# Patient Record
Sex: Female | Born: 1990 | Race: Black or African American | Hispanic: No | Marital: Single | Smoking: Never smoker
Health system: Southern US, Community
[De-identification: ages and names within clinical notes are randomized; demographics above are authoritative.]

## PROBLEM LIST (undated history)

## (undated) ENCOUNTER — Inpatient Hospital Stay (HOSPITAL_COMMUNITY): Payer: Self-pay

## (undated) DIAGNOSIS — R569 Unspecified convulsions: Secondary | ICD-10-CM

## (undated) DIAGNOSIS — R29898 Other symptoms and signs involving the musculoskeletal system: Secondary | ICD-10-CM

## (undated) HISTORY — DX: Other symptoms and signs involving the musculoskeletal system: R29.898

## (undated) HISTORY — PX: NO PAST SURGERIES: SHX2092

---
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
## 2011-01-11 LAB — COMPREHENSIVE METABOLIC PANEL
ALT: 13 U/L (ref 0–35)
Alkaline Phosphatase: 65 U/L (ref 39–117)
CO2: 29 mEq/L (ref 19–32)
Glucose, Bld: 93 mg/dL (ref 70–99)
Potassium: 3.3 mEq/L — ABNORMAL LOW (ref 3.5–5.1)
Sodium: 140 mEq/L (ref 135–145)
Total Protein: 7.3 g/dL (ref 6.0–8.3)

## 2011-01-11 LAB — RETICULOCYTES: Retic Count, Absolute: 50.8 10*3/uL (ref 19.0–186.0)

## 2011-01-11 LAB — URINALYSIS, ROUTINE W REFLEX MICROSCOPIC
Nitrite: NEGATIVE
Specific Gravity, Urine: 1.036 — ABNORMAL HIGH (ref 1.005–1.030)
Urobilinogen, UA: 1 mg/dL (ref 0.0–1.0)

## 2011-01-11 LAB — CBC
MCHC: 32.1 g/dL (ref 30.0–36.0)
RBC: 5.34 MIL/uL — ABNORMAL HIGH (ref 3.87–5.11)
WBC: 6.2 10*3/uL (ref 4.0–10.5)

## 2011-01-11 LAB — LIPASE, BLOOD: Lipase: 25 U/L (ref 11–59)

## 2011-01-11 LAB — PREGNANCY, URINE

## 2011-01-11 LAB — URINE MICROSCOPIC-ADD ON

## 2011-01-11 LAB — DIFFERENTIAL
Eosinophils Absolute: 0 10*3/uL (ref 0.0–0.7)
Eosinophils Relative: 0 % (ref 0–5)
Lymphs Abs: 0.8 10*3/uL (ref 0.7–4.0)
Monocytes Absolute: 0.4 10*3/uL (ref 0.1–1.0)

## 2011-10-15 ENCOUNTER — Emergency Department (HOSPITAL_BASED_OUTPATIENT_CLINIC_OR_DEPARTMENT_OTHER)
Admission: EM | Admit: 2011-10-15 | Discharge: 2011-10-15 | Disposition: A | Discharge status: Home / Self Care | Payer: Medicaid Other | Attending: Emergency Medicine | Admitting: Emergency Medicine

## 2011-10-15 ENCOUNTER — Encounter (HOSPITAL_BASED_OUTPATIENT_CLINIC_OR_DEPARTMENT_OTHER): Payer: Self-pay | Admitting: *Deleted

## 2011-10-15 DIAGNOSIS — H9209 Otalgia, unspecified ear: Secondary | ICD-10-CM | POA: Insufficient documentation

## 2011-10-15 DIAGNOSIS — J069 Acute upper respiratory infection, unspecified: Secondary | ICD-10-CM | POA: Insufficient documentation

## 2011-10-15 HISTORY — DX: Unspecified convulsions: R56.9

## 2011-10-15 MED ORDER — HYDROCOD POLST-CHLORPHEN POLST 10-8 MG/5ML PO LQCR: 5.0000 mL | Freq: Two times a day (BID) | ORAL | Status: DC | PRN

## 2011-10-15 NOTE — ED Notes (Signed)
Cough, runny nose, sore throat and headache x 2 days.

## 2011-10-15 NOTE — ED Provider Notes (Signed)
History     CSN: [id number]  Arrival date & time 10/15/11  1334   First MD Initiated Contact with Patient 10/15/11 1344      Chief Complaint  Patient presents with  . URI    (Consider location/radiation/quality/duration/timing/severity/associated sxs/prior treatment) Patient is a 21 y.o. female presenting with URI. The history is provided by the patient.  URI The primary symptoms include fatigue, ear pain, sore throat and cough. Primary symptoms do not include fever, nausea or vomiting. The current episode started 2 days ago. This is a new problem.  Symptoms associated with the illness include plugged ear sensation, congestion and rhinorrhea. The illness is not associated with chills or facial pain.    Past Medical History  Diagnosis Date  . Seizures     History reviewed. No pertinent past surgical history.  No family history on file.  History  Substance Use Topics  . Smoking status: Never Smoker   . Smokeless tobacco: Not on file  . Alcohol Use: No    OB History    Grav Para Term Preterm Abortions TAB SAB Ect Mult Living                  Review of Systems  Constitutional: Positive for fatigue. Negative for fever and chills.  HENT: Positive for ear pain, congestion, sore throat and rhinorrhea.   Respiratory: Positive for cough.   Gastrointestinal: Negative for nausea and vomiting.  All other systems reviewed and are negative.    Allergies  Review of patient's allergies indicates no known allergies.  Home Medications  No current outpatient prescriptions on file.  BP 125/84  Pulse 99  Temp(Src) 98.2 F (36.8 C) (Oral)  Resp 20  Wt 126 lb (57.153 kg)  SpO2 99%  LMP 10/07/2011  Physical Exam  Nursing note and vitals reviewed. Constitutional: She is oriented to person, place, and time. She appears well-developed and well-nourished. No distress.  HENT:  Head: Normocephalic and atraumatic.  Right Ear: External ear normal.  Left Ear: External ear  normal.  Mouth/Throat: Oropharynx is clear and moist. No oropharyngeal exudate.  Neck: Normal range of motion. Neck supple.  Cardiovascular: Normal rate and regular rhythm.  Exam reveals no gallop and no friction rub.   No murmur heard. Pulmonary/Chest: Effort normal and breath sounds normal. No respiratory distress. She has no wheezes.  Abdominal: Soft. Bowel sounds are normal. She exhibits no distension. There is no tenderness.  Musculoskeletal: Normal range of motion.  Lymphadenopathy:    She has no cervical adenopathy.  Neurological: She is alert and oriented to person, place, and time.  Skin: Skin is warm and dry. She is not diaphoretic.    ED Course  Procedures (including critical care time)   Labs Reviewed  RAPID STREP SCREEN   No results found.   No diagnosis found.    MDM  The strep test is negative.  This illness is almost certainly viral in nature.  Will prescribe tussionex for cough and this should help with some of the aches and pains she is reporting.  Could possibly be influenza, however out of the age group and time range to prescribe tamiflu.          Geoffery Lyons, MD 10/15/11 [phone]

## 2012-08-24 ENCOUNTER — Encounter (HOSPITAL_COMMUNITY): Payer: Self-pay | Admitting: *Deleted

## 2012-08-24 ENCOUNTER — Emergency Department (HOSPITAL_COMMUNITY)
Admission: EM | Admit: 2012-08-24 | Discharge: 2012-08-25 | Discharge status: Against Medical Advice | Payer: Medicaid Other | Attending: Emergency Medicine | Admitting: Emergency Medicine

## 2012-08-24 DIAGNOSIS — R52 Pain, unspecified: Secondary | ICD-10-CM | POA: Insufficient documentation

## 2012-08-24 DIAGNOSIS — N949 Unspecified condition associated with female genital organs and menstrual cycle: Secondary | ICD-10-CM | POA: Insufficient documentation

## 2012-08-24 DIAGNOSIS — K920 Hematemesis: Secondary | ICD-10-CM | POA: Insufficient documentation

## 2012-08-24 NOTE — ED Notes (Addendum)
Pt states her period started today. Pt states her BC pill isn't working. Everytime she starts her cycle she vomits and she started vomiting blood today. Pt also states she cannot breath and is having generalized body pain "pain all over body. "Pt has pants unzipped in triage due to pain in her pelvis.

## 2012-08-24 NOTE — ED Notes (Signed)
Pt called from triage and general wait with no reply.

## 2012-08-24 NOTE — ED Notes (Signed)
Pt refused blood work  

## 2012-08-25 ENCOUNTER — Emergency Department (HOSPITAL_COMMUNITY)
Admission: EM | Admit: 2012-08-25 | Discharge: 2012-08-25 | Disposition: A | Discharge status: Home / Self Care | Payer: Medicaid Other | Source: Home / Self Care | Attending: Family Medicine | Admitting: Family Medicine

## 2012-08-25 ENCOUNTER — Encounter (HOSPITAL_COMMUNITY): Payer: Self-pay | Admitting: Emergency Medicine

## 2012-08-25 DIAGNOSIS — N946 Dysmenorrhea, unspecified: Secondary | ICD-10-CM

## 2012-08-25 LAB — POCT URINALYSIS DIP (DEVICE)
Glucose, UA: 100 mg/dL — AB
Ketones, ur: 40 mg/dL — AB
pH: 8.5 — ABNORMAL HIGH (ref 5.0–8.0)

## 2012-08-25 MED ORDER — ONDANSETRON 4 MG PO TBDP
ORAL_TABLET | ORAL | Status: AC
  Filled 2012-08-25: qty 2

## 2012-08-25 MED ORDER — ONDANSETRON 4 MG PO TBDP
8.0000 mg | ORAL_TABLET | Freq: Once | ORAL | Status: AC
  Administered 2012-08-25: 8 mg via ORAL

## 2012-08-25 MED ORDER — KETOROLAC TROMETHAMINE 30 MG/ML IJ SOLN
30.0000 mg | Freq: Once | INTRAMUSCULAR | Status: AC
  Administered 2012-08-25: 30 mg via INTRAMUSCULAR

## 2012-08-25 MED ORDER — KETOROLAC TROMETHAMINE 30 MG/ML IJ SOLN
INTRAMUSCULAR | Status: AC
  Filled 2012-08-25: qty 1

## 2012-08-25 MED ORDER — TRAMADOL HCL 50 MG PO TABS: 50.0000 mg | ORAL_TABLET | Freq: Four times a day (QID) | ORAL | Status: DC | PRN

## 2012-08-25 MED ORDER — IBUPROFEN 600 MG PO TABS: 600.0000 mg | ORAL_TABLET | Freq: Three times a day (TID) | ORAL | Status: DC | PRN

## 2012-08-25 MED ORDER — PROMETHAZINE HCL 25 MG PO TABS: 25.0000 mg | ORAL_TABLET | Freq: Three times a day (TID) | ORAL | Status: DC | PRN

## 2012-08-25 NOTE — ED Provider Notes (Signed)
History     CSN: [id number]  Arrival date & time 08/25/12  0945   First MD Initiated Contact with Patient 08/25/12 1018      Chief Complaint  Patient presents with  . Abdominal Pain    (Consider location/radiation/quality/duration/timing/severity/associated sxs/prior treatment) HPI Comments: 21 year old female with history of painful menstrual periods. Here with her mother complaining of menstrual cramps and low back pain since yesterday. Started with her period yesterday. Has not taken any pain medication as in the past her doctor prescribed naproxen but this medication does not help with her cramps as per patient. Denies heavy bleeding. Denies dysuria. Denies pelvic pain or vaginal discharge prior the beginning of her menstruation and menstrual cramps. No headache or dizziness. No nausea vomiting or diarrhea. Last bowel movement was 2 days ago and normal. Patient has history of constipation and defecates about twice a week when regular. No fever or chills.   Past Medical History  Diagnosis Date  . Seizures     History reviewed. No pertinent past surgical history.  History reviewed. No pertinent family history.  History  Substance Use Topics  . Smoking status: Never Smoker   . Smokeless tobacco: Not on file  . Alcohol Use: No    OB History    Grav Para Term Preterm Abortions TAB SAB Ect Mult Living                  Review of Systems  Constitutional: Negative for fever and chills.  Gastrointestinal: Positive for abdominal pain.  Genitourinary: Positive for vaginal bleeding, menstrual problem and pelvic pain. Negative for dysuria, urgency, frequency, hematuria, flank pain, vaginal discharge, difficulty urinating and genital sores.  Musculoskeletal: Positive for back pain.  Skin: Negative for rash.  Neurological: Negative for dizziness and headaches.    Allergies  Review of patient's allergies indicates no known allergies.  Home Medications   Current Outpatient Rx   Name  Route  Sig  Dispense  Refill  . IBUPROFEN 600 MG PO TABS   Oral   Take 1 tablet (600 mg total) by mouth every 8 (eight) hours as needed for pain.   20 tablet   1   . PROMETHAZINE HCL 25 MG PO TABS   Oral   Take 1 tablet (25 mg total) by mouth every 8 (eight) hours as needed for nausea.   15 tablet   0   . TRAMADOL HCL 50 MG PO TABS   Oral   Take 1 tablet (50 mg total) by mouth every 6 (six) hours as needed for pain.   20 tablet   0     BP 128/75  Pulse 91  Temp 99.9 F (37.7 C) (Oral)  Resp 20  SpO2 100%  Physical Exam  Constitutional: She is oriented to person, place, and time. She appears well-developed and well-nourished.       Uncomfortable with pain  HENT:  Mouth/Throat: Oropharynx is clear and moist. No oropharyngeal exudate.  Eyes: Conjunctivae normal are normal. No scleral icterus.  Neck: No thyromegaly present.  Cardiovascular: Normal heart sounds.   Pulmonary/Chest: Breath sounds normal.  Abdominal: Soft. Bowel sounds are normal. She exhibits no distension and no mass. There is no tenderness. There is no rebound and no guarding.  Neurological: She is alert and oriented to person, place, and time.  Skin: No rash noted. She is not diaphoretic.    ED Course  Procedures (including critical care time)  Labs Reviewed  POCT URINALYSIS DIP (DEVICE) - Abnormal;  Notable for the following:    Glucose, UA 100 (*)     Bilirubin Urine SMALL (*)     Ketones, ur 40 (*)     Hgb urine dipstick LARGE (*)     pH 8.5 (*)     Protein, ur 100 (*)     Urobilinogen, UA 2.0 (*)     Leukocytes, UA TRACE (*)  Biochemical Testing Only. Please order routine urinalysis from main lab if confirmatory testing is needed.   All other components within normal limits  POCT PREGNANCY, URINE  POCT RAPID STREP A ([facility] CARE ONLY)   No results found.   1. Dysmenorrhea       MDM  Normal abdominal exam. Treated here with Toradol 30 mg IM x1 and ondansetron 8 mg sublingual  x1. Patient was tolerating fluids and pain was improved prior to discharge. Prescribed ibuprofen 600 mg 3 times a day when necessary and tramadol every 6 hours as needed. Also given a prescription for promethazine 25 mg 3 times a day when necessary for nausea vomiting. Red flags that should prompt her return to medical attention this cause with patient, her mother and provided in writing.        Sharin Grave, MD 08/26/12 2013

## 2012-08-25 NOTE — ED Notes (Signed)
Reports abd. Pain coming from menstrual cycle.  Patient reports vomiting and becoming nausea every month.  Patient states the pain is in the pelvis area and butt.  Patient takes naproxen for pain but no relief.

## 2012-08-25 NOTE — ED Notes (Addendum)
Pt called no answer 

## 2012-08-25 NOTE — ED Notes (Signed)
Went to send patient to restroom for urine specimen, patient's companion very rude, wanted to know when doctor is coming in," has been waiting too long" per companion. I told this person that provider was in with another patient and would be in soon.

## 2013-03-19 ENCOUNTER — Encounter (HOSPITAL_BASED_OUTPATIENT_CLINIC_OR_DEPARTMENT_OTHER): Payer: Self-pay

## 2013-03-19 ENCOUNTER — Emergency Department (HOSPITAL_BASED_OUTPATIENT_CLINIC_OR_DEPARTMENT_OTHER)
Admission: EM | Admit: 2013-03-19 | Discharge: 2013-03-19 | Disposition: A | Discharge status: Home / Self Care | Payer: Medicaid Other | Attending: Emergency Medicine | Admitting: Emergency Medicine

## 2013-03-19 DIAGNOSIS — R112 Nausea with vomiting, unspecified: Secondary | ICD-10-CM | POA: Insufficient documentation

## 2013-03-19 DIAGNOSIS — J029 Acute pharyngitis, unspecified: Secondary | ICD-10-CM | POA: Insufficient documentation

## 2013-03-19 DIAGNOSIS — N390 Urinary tract infection, site not specified: Secondary | ICD-10-CM | POA: Insufficient documentation

## 2013-03-19 DIAGNOSIS — H53149 Visual discomfort, unspecified: Secondary | ICD-10-CM | POA: Insufficient documentation

## 2013-03-19 DIAGNOSIS — Z3202 Encounter for pregnancy test, result negative: Secondary | ICD-10-CM | POA: Insufficient documentation

## 2013-03-19 DIAGNOSIS — Z8669 Personal history of other diseases of the nervous system and sense organs: Secondary | ICD-10-CM | POA: Insufficient documentation

## 2013-03-19 LAB — URINE MICROSCOPIC-ADD ON

## 2013-03-19 LAB — URINALYSIS, ROUTINE W REFLEX MICROSCOPIC
Hgb urine dipstick: NEGATIVE
Nitrite: NEGATIVE
Protein, ur: 30 mg/dL — AB
Specific Gravity, Urine: 1.03 (ref 1.005–1.030)
Urobilinogen, UA: 2 mg/dL — ABNORMAL HIGH (ref 0.0–1.0)

## 2013-03-19 LAB — PREGNANCY, URINE: Preg Test, Ur: NEGATIVE

## 2013-03-19 MED ORDER — METOCLOPRAMIDE HCL 5 MG/ML IJ SOLN
10.0000 mg | Freq: Once | INTRAMUSCULAR | Status: AC
  Administered 2013-03-19: 10 mg via INTRAVENOUS
  Filled 2013-03-19: qty 2

## 2013-03-19 MED ORDER — DIPHENHYDRAMINE HCL 50 MG/ML IJ SOLN
25.0000 mg | Freq: Once | INTRAMUSCULAR | Status: AC
  Administered 2013-03-19: 05:00:00 via INTRAVENOUS
  Filled 2013-03-19: qty 1

## 2013-03-19 MED ORDER — DEXTROSE 5 % IV SOLN
1.0000 g | Freq: Once | INTRAVENOUS | Status: AC
  Administered 2013-03-19: 1 g via INTRAVENOUS
  Filled 2013-03-19: qty 10

## 2013-03-19 MED ORDER — SODIUM CHLORIDE 0.9 % IV BOLUS (SEPSIS)
1000.0000 mL | Freq: Once | INTRAVENOUS | Status: AC
  Administered 2013-03-19: 1000 mL via INTRAVENOUS

## 2013-03-19 MED ORDER — NITROFURANTOIN MONOHYD MACRO 100 MG PO CAPS: 100.0000 mg | ORAL_CAPSULE | Freq: Two times a day (BID) | ORAL | Status: DC

## 2013-03-19 MED ORDER — KETOROLAC TROMETHAMINE 15 MG/ML IJ SOLN
15.0000 mg | Freq: Once | INTRAMUSCULAR | Status: AC
  Administered 2013-03-19: 15 mg via INTRAVENOUS
  Filled 2013-03-19: qty 1

## 2013-03-19 NOTE — ED Notes (Signed)
Pt states that her mom is not able to pick her up as planned. Pt has no family or resources to provide a ride. Will provide a cab voucher for pt.

## 2013-03-19 NOTE — ED Notes (Signed)
Patient here with generalized headache since Friday. Reports now she has had nausea and some vomiting with same. Denies trauma. Alert and oriented. Taking otc meds without relief

## 2013-03-19 NOTE — ED Provider Notes (Signed)
History     CSN: [id number]  Arrival date & time 03/19/13  0251   First MD Initiated Contact with Patient 03/19/13 [phone]      Chief Complaint  Patient presents with  . Headache    (Consider location/radiation/quality/duration/timing/severity/associated sxs/prior treatment) HPI This is a 22 year old female with a three-day history of headache. The headache began in her midforehead but is subsequently generalized. She is unable to characterize or quantify the pain except as "it's bad". The pain has waxed and waned. It has not been relieved with ibuprofen or Tylenol. It has been accompanied by nausea and vomiting. It has also been accompanied by mild photophobia and mild sore throat. She states she's not had anything to eat or drink in 3 days due to the nausea. She denies any neck stiffness, abdominal pain, dysuria or diarrhea.  Past Medical History  Diagnosis Date  . Seizures     History reviewed. No pertinent past surgical history.  No family history on file.  History  Substance Use Topics  . Smoking status: Never Smoker   . Smokeless tobacco: Not on file  . Alcohol Use: No    OB History   Grav Para Term Preterm Abortions TAB SAB Ect Mult Living                  Review of Systems  All other systems reviewed and are negative.    Allergies  Review of patient's allergies indicates no known allergies.  Home Medications   Current Outpatient Rx  Name  Route  Sig  Dispense  Refill  . ibuprofen (ADVIL,MOTRIN) 600 MG tablet   Oral   Take 1 tablet (600 mg total) by mouth every 8 (eight) hours as needed for pain.   20 tablet   1     BP 149/84  Pulse 100  Temp(Src) 99.6 F (37.6 C) (Oral)  Resp 18  Wt 169 lb (76.658 kg)  SpO2 98%  Physical Exam General: Well-developed, well-nourished female in no acute distress; appearance consistent with age of record HENT: normocephalic, atraumatic; no pharyngeal erythema or exudate Eyes: pupils equal round and reactive to  light; extraocular muscles intact Neck: supple; no lymphadenopathy Heart: regular rate and rhythm Lungs: clear to auscultation bilaterally Abdomen: soft; nondistended; nontender; no masses or hepatosplenomegaly; bowel sounds present GU: No CVA tenderness Extremities: No deformity; full range of motion; pulses normal Neurologic: Awake, alert and oriented; motor function intact in all extremities and symmetric; no facial droop; normal coordination and speech Skin: Warm and dry Psychiatric: Normal mood and affect    ED Course  Procedures (including critical care time)    MDM   Nursing notes and vitals signs, including pulse oximetry, reviewed.  Summary of this visit's results, reviewed by myself:  Labs:  Results for orders placed during the hospital encounter of 03/19/13 (from the past 24 hour(s))  URINALYSIS, ROUTINE W REFLEX MICROSCOPIC     Status: Abnormal   Collection Time    03/19/13  4:33 AM      Result Value Range   Color, Urine YELLOW  YELLOW   APPearance CLOUDY (*) CLEAR   Specific Gravity, Urine 1.030  1.005 - 1.030   pH 7.0  5.0 - 8.0   Glucose, UA NEGATIVE  NEGATIVE mg/dL   Hgb urine dipstick NEGATIVE  NEGATIVE   Bilirubin Urine SMALL (*) NEGATIVE   Ketones, ur 15 (*) NEGATIVE mg/dL   Protein, ur 30 (*) NEGATIVE mg/dL   Urobilinogen, UA 2.0 (*) 0.0 -  1.0 mg/dL   Nitrite NEGATIVE  NEGATIVE   Leukocytes, UA LARGE (*) NEGATIVE  PREGNANCY, URINE     Status: None   Collection Time    03/19/13  4:33 AM      Result Value Range   Preg Test, Ur NEGATIVE  NEGATIVE  URINE MICROSCOPIC-ADD ON     Status: Abnormal   Collection Time    03/19/13  4:33 AM      Result Value Range   Squamous Epithelial / LPF MANY (*) RARE   WBC, UA TOO NUMEROUS TO COUNT  <3 WBC/hpf   RBC / HPF 0-2  <3 RBC/hpf   Bacteria, UA MANY (*) RARE   5:47 AM Headache improved after IV medications and IV fluids. Rocephin 1 g given to initiate treatment for urinary tract  infection.        Hanley Seamen, MD 03/19/13 [phone]

## 2013-03-20 LAB — URINE CULTURE: Colony Count: 70000

## 2013-09-27 NOTE — L&D Delivery Note (Signed)
Delivery Note Pt reached complete dilation and pushed about an hour.  At 7:05 PM a healthy female was delivered via Vaginal, Spontaneous Delivery (Presentation: ; Occiput Anterior).  APGAR: 8, 9; weight 6 lb 7 oz (2920 g).   Placenta status: Intact, Spontaneous.  Cord: 3 vessels with the following complications: None.    Anesthesia: None  Episiotomy: None Lacerations: 2nd degree;Perineal Suture Repair: 3.0 vicryl rapide Est. Blood Loss (mL): 350cc  Mom to postpartum.  Baby to Couplet care / Skin to Skin. D/w pt circumcision.  Plan to do in office.  Oliver PilaRICHARDSON,Zakariyah Freimark W 08/27/2014, 7:48 PM

## 2013-11-26 ENCOUNTER — Emergency Department (HOSPITAL_COMMUNITY)
Admission: EM | Admit: 2013-11-26 | Discharge: 2013-11-26 | Disposition: A | Discharge status: Home / Self Care | Payer: Medicaid Other | Attending: Emergency Medicine | Admitting: Emergency Medicine

## 2013-11-26 ENCOUNTER — Encounter (HOSPITAL_COMMUNITY): Payer: Self-pay | Admitting: Emergency Medicine

## 2013-11-26 ENCOUNTER — Emergency Department (HOSPITAL_COMMUNITY): Payer: Medicaid Other

## 2013-11-26 DIAGNOSIS — R071 Chest pain on breathing: Secondary | ICD-10-CM | POA: Insufficient documentation

## 2013-11-26 DIAGNOSIS — R079 Chest pain, unspecified: Secondary | ICD-10-CM

## 2013-11-26 DIAGNOSIS — Z8669 Personal history of other diseases of the nervous system and sense organs: Secondary | ICD-10-CM | POA: Insufficient documentation

## 2013-11-26 DIAGNOSIS — R0789 Other chest pain: Secondary | ICD-10-CM

## 2013-11-26 LAB — RAPID URINE DRUG SCREEN, HOSP PERFORMED
Amphetamines: NOT DETECTED
BENZODIAZEPINES: NOT DETECTED
Barbiturates: NOT DETECTED
Cocaine: NOT DETECTED
Opiates: POSITIVE — AB
Tetrahydrocannabinol: NOT DETECTED

## 2013-11-26 LAB — COMPREHENSIVE METABOLIC PANEL
ALK PHOS: 94 U/L (ref 39–117)
ALT: 10 U/L (ref 0–35)
AST: 16 U/L (ref 0–37)
Albumin: 3.4 g/dL — ABNORMAL LOW (ref 3.5–5.2)
BUN: 7 mg/dL (ref 6–23)
CALCIUM: 9.1 mg/dL (ref 8.4–10.5)
CO2: 24 mEq/L (ref 19–32)
Chloride: 104 mEq/L (ref 96–112)
Creatinine, Ser: 0.82 mg/dL (ref 0.50–1.10)
GFR calc non Af Amer: >90 mL/min (ref >90)
GLUCOSE: 93 mg/dL (ref 70–99)
Potassium: 3.5 mEq/L — ABNORMAL LOW (ref 3.7–5.3)
Sodium: 139 mEq/L (ref 137–147)
TOTAL PROTEIN: 7 g/dL (ref 6.0–8.3)
Total Bilirubin: 0.2 mg/dL — ABNORMAL LOW (ref 0.3–1.2)

## 2013-11-26 LAB — CBC
HEMATOCRIT: 34.2 % — AB (ref 36.0–46.0)
HEMOGLOBIN: 10.8 g/dL — AB (ref 12.0–15.0)
MCH: 24.9 pg — AB (ref 26.0–34.0)
MCHC: 31.6 g/dL (ref 30.0–36.0)
MCV: 78.8 fL (ref 78.0–100.0)
Platelets: 247 10*3/uL (ref 150–400)
RBC: 4.34 MIL/uL (ref 3.87–5.11)
RDW: 14.8 % (ref 11.5–15.5)
WBC: 5.3 10*3/uL (ref 4.0–10.5)

## 2013-11-26 LAB — TROPONIN I

## 2013-11-26 MED ORDER — HYDROCODONE-ACETAMINOPHEN 5-325 MG PO TABS
1.0000 | ORAL_TABLET | Freq: Once | ORAL | Status: AC
  Administered 2013-11-26: 1 via ORAL
  Filled 2013-11-26: qty 1

## 2013-11-26 MED ORDER — IBUPROFEN 200 MG PO TABS
600.0000 mg | ORAL_TABLET | Freq: Once | ORAL | Status: AC
  Administered 2013-11-26: 600 mg via ORAL
  Filled 2013-11-26: qty 3

## 2013-11-26 NOTE — Discharge Instructions (Signed)
Take motrin or aleve as need for pain. Follow up with primary care doctor in 1 week - see referral - call office to arrange appointment. Return to ER right away if worse, persistent or recurrent chest pain, trouble breathing, fevers, other concern.    You were given pain medication in the ER - no driving for the next 4 hours.     Chest Pain (Nonspecific) It is often hard to give a specific diagnosis for the cause of chest pain. There is always a chance that your pain could be related to something serious, such as a heart attack or a blood clot in the lungs. You need to follow up with your caregiver for further evaluation. CAUSES   Heartburn.  Pneumonia or bronchitis.  Anxiety or stress.  Inflammation around your heart (pericarditis) or lung (pleuritis or pleurisy).  A blood clot in the lung.  A collapsed lung (pneumothorax). It can develop suddenly on its own (spontaneous pneumothorax) or from injury (trauma) to the chest.  Shingles infection (herpes zoster virus). The chest wall is composed of bones, muscles, and cartilage. Any of these can be the source of the pain.  The bones can be bruised by injury.  The muscles or cartilage can be strained by coughing or overwork.  The cartilage can be affected by inflammation and become sore (costochondritis). DIAGNOSIS  Lab tests or other studies, such as X-rays, electrocardiography, stress testing, or cardiac imaging, may be needed to find the cause of your pain.  TREATMENT   Treatment depends on what may be causing your chest pain. Treatment may include:  Acid blockers for heartburn.  Anti-inflammatory medicine.  Pain medicine for inflammatory conditions.  Antibiotics if an infection is present.  You may be advised to change lifestyle habits. This includes stopping smoking and avoiding alcohol, caffeine, and chocolate.  You may be advised to keep your head raised (elevated) when sleeping. This reduces the chance of acid  going backward from your stomach into your esophagus.  Most of the time, nonspecific chest pain will improve within 2 to 3 days with rest and mild pain medicine. HOME CARE INSTRUCTIONS   If antibiotics were prescribed, take your antibiotics as directed. Finish them even if you start to feel better.  For the next few days, avoid physical activities that bring on chest pain. Continue physical activities as directed.  Do not smoke.  Avoid drinking alcohol.  Only take over-the-counter or prescription medicine for pain, discomfort, or fever as directed by your caregiver.  Follow your caregiver's suggestions for further testing if your chest pain does not go away.  Keep any follow-up appointments you made. If you do not go to an appointment, you could develop lasting (chronic) problems with pain. If there is any problem keeping an appointment, you must call to reschedule. SEEK MEDICAL CARE IF:   You think you are having problems from the medicine you are taking. Read your medicine instructions carefully.  Your chest pain does not go away, even after treatment.  You develop a rash with blisters on your chest. SEEK IMMEDIATE MEDICAL CARE IF:   You have increased chest pain or pain that spreads to your arm, neck, jaw, back, or abdomen.  You develop shortness of breath, an increasing cough, or you are coughing up blood.  You have severe back or abdominal pain, feel nauseous, or vomit.  You develop severe weakness, fainting, or chills.  You have a fever. THIS IS AN EMERGENCY. Do not wait to see if the  pain will go away. Get medical help at once. Call your local emergency services (911 in [place] Do not drive yourself to the hospital. MAKE SURE YOU:   Understand these instructions.  Will watch your condition.  Will get help right away if you are not doing well or get worse. Document Released: 06/23/2005 Document Revised: 12/06/2011 Document Reviewed: 04/18/2008 [facility] Patient  Information 2014 [place], [place].      Chest Wall Pain Chest wall pain is pain in or around the bones and muscles of your chest. It may take up to 6 weeks to get better. It may take longer if you must stay physically active in your work and activities.  CAUSES  Chest wall pain may happen on its own. However, it may be caused by:  A viral illness like the flu.  Injury.  Coughing.  Exercise.  Arthritis.  Fibromyalgia.  Shingles. HOME CARE INSTRUCTIONS   Avoid overtiring physical activity. Try not to strain or perform activities that cause pain. This includes any activities using your chest or your abdominal and side muscles, especially if heavy weights are used.  Put ice on the sore area.  Put ice in a plastic bag.  Place a towel between your skin and the bag.  Leave the ice on for 15-20 minutes per hour while awake for the first 2 days.  Only take over-the-counter or prescription medicines for pain, discomfort, or fever as directed by your caregiver. SEEK IMMEDIATE MEDICAL CARE IF:   Your pain increases, or you are very uncomfortable.  You have a fever.  Your chest pain becomes worse.  You have new, unexplained symptoms.  You have nausea or vomiting.  You feel sweaty or lightheaded.  You have a cough with phlegm (sputum), or you cough up blood. MAKE SURE YOU:   Understand these instructions.  Will watch your condition.  Will get help right away if you are not doing well or get worse. Document Released: 09/13/2005 Document Revised: 12/06/2011 Document Reviewed: 05/10/2011 [facility] Patient Information 2014 [place], [place].  Chest Wall Pain Chest wall pain is pain in or around the bones and muscles of your chest. It may take up to 6 weeks to get better. It may take longer if you must stay physically active in your work and activities.  CAUSES  Chest wall pain may happen on its own. However, it may be caused by:  A viral illness like the  flu.  Injury.  Coughing.  Exercise.  Arthritis.  Fibromyalgia.  Shingles. HOME CARE INSTRUCTIONS   Avoid overtiring physical activity. Try not to strain or perform activities that cause pain. This includes any activities using your chest or your abdominal and side muscles, especially if heavy weights are used.  Put ice on the sore area.  Put ice in a plastic bag.  Place a towel between your skin and the bag.  Leave the ice on for 15-20 minutes per hour while awake for the first 2 days.  Only take over-the-counter or prescription medicines for pain, discomfort, or fever as directed by your caregiver. SEEK IMMEDIATE MEDICAL CARE IF:   Your pain increases, or you are very uncomfortable.  You have a fever.  Your chest pain becomes worse.  You have new, unexplained symptoms.  You have nausea or vomiting.  You feel sweaty or lightheaded.  You have a cough with phlegm (sputum), or you cough up blood. MAKE SURE YOU:   Understand these instructions.  Will watch your condition.  Will get help right  away if you are not doing well or get worse. Document Released: 09/13/2005 Document Revised: 12/06/2011 Document Reviewed: 05/10/2011 [facility] Patient Information 2014 [place], [place].

## 2013-11-26 NOTE — ED Notes (Signed)
Pt from home, c/o chest tightness x 1 week, Today pain will not go away per pt. Denies sob, n/v/e. Rates pain 10/10. NSD.

## 2013-11-26 NOTE — ED Provider Notes (Signed)
CSN: [id number]     Arrival date & time 11/26/13  [id number] History   First MD Initiated Contact with Patient 11/26/13 0710     Chief Complaint  Patient presents with  . Chest Pain     (Consider location/radiation/quality/duration/timing/severity/associated sxs/prior Treatment) Patient is a 23 y.o. female presenting with chest pain. The history is provided by the patient and the EMS personnel.  Chest Pain Associated symptoms: no abdominal pain, no back pain, no cough, no fever, no headache, no nausea, no palpitations, no shortness of breath and not vomiting   pt c/o mid chest pain x 1 week. Initially was episodic, lasting seconds per episode. States constant in past couple days. Sharp. Not pleuritic. States worse w turning/rolling over in bed, palpation chest. No associated nv, diaphoresis or sob. Denies any change in pain whether upright or lying supine. Denies recent cough or uri symptoms. No recent febrile illness. No chills/sweats. Non smoker. No drug use. No hx heart or lung disease. No fam hx heart disease or MI. Denies pleuritic pain. No leg pain or swelling. No hx dvt or pe. No recent travel, surgery, ocp use, prolonged immobility or trauma. No chest wall injury or strain.      Past Medical History  Diagnosis Date  . Seizures    History reviewed. No pertinent past surgical history. History reviewed. No pertinent family history. History  Substance Use Topics  . Smoking status: Never Smoker   . Smokeless tobacco: Not on file  . Alcohol Use: No   OB History   Grav Para Term Preterm Abortions TAB SAB Ect Mult Living                 Review of Systems  Constitutional: Negative for fever and chills.  HENT: Negative for congestion and sore throat.   Eyes: Negative for redness.  Respiratory: Negative for cough and shortness of breath.   Cardiovascular: Positive for chest pain. Negative for palpitations and leg swelling.  Gastrointestinal: Negative for nausea, vomiting and abdominal  pain.  Genitourinary: Negative for flank pain.  Musculoskeletal: Negative for back pain and neck pain.  Skin: Negative for rash.  Neurological: Negative for headaches.  Hematological: Does not bruise/bleed easily.  Psychiatric/Behavioral: Negative for confusion.      Allergies  Review of patient's allergies indicates no known allergies.  Home Medications   Current Outpatient Rx  Name  Route  Sig  Dispense  Refill  . ibuprofen (ADVIL,MOTRIN) 200 MG tablet   Oral   Take 400 mg by mouth every 6 (six) hours as needed for moderate pain.          BP 131/77  Pulse 89  Temp(Src) 98.4 F (36.9 C) (Oral)  Resp 12  Ht 5\' 4"  (1.626 m)  Wt 182 lb 4 oz (82.668 kg)  BMI 31.27 kg/m2  SpO2 100% Physical Exam  Nursing note and vitals reviewed. Constitutional: She is oriented to person, place, and time. She appears well-developed and well-nourished. No distress.  HENT:  Mouth/Throat: Oropharynx is clear and moist.  Eyes: Conjunctivae are normal. No scleral icterus.  Neck: Neck supple. No JVD present. No tracheal deviation present.  Cardiovascular: Normal rate, regular rhythm, normal heart sounds and intact distal pulses.  Exam reveals no gallop and no friction rub.   No murmur heard. Pulmonary/Chest: Effort normal and breath sounds normal. No respiratory distress. She exhibits tenderness.  Abdominal: Soft. Normal appearance and bowel sounds are normal. She exhibits no distension. There is no tenderness.  Musculoskeletal: She  exhibits no edema and no tenderness.  Neurological: She is alert and oriented to person, place, and time.  Skin: Skin is warm and dry. No rash noted. She is not diaphoretic.  Psychiatric: She has a normal mood and affect.    ED Course  Procedures (including critical care time)   Results for orders placed during the hospital encounter of 11/26/13  CBC      Result Value Ref Range   WBC 5.3  4.0 - 10.5 K/uL   RBC 4.34  3.87 - 5.11 MIL/uL   Hemoglobin 10.8  (*) 12.0 - 15.0 g/dL   HCT 16.1 (*) 09.6 - 04.5 %   MCV 78.8  78.0 - 100.0 fL   MCH 24.9 (*) 26.0 - 34.0 pg   MCHC 31.6  30.0 - 36.0 g/dL   RDW 40.9  81.1 - 91.4 %   Platelets 247  150 - 400 K/uL  COMPREHENSIVE METABOLIC PANEL      Result Value Ref Range   Sodium 139  137 - 147 mEq/L   Potassium 3.5 (*) 3.7 - 5.3 mEq/L   Chloride 104  96 - 112 mEq/L   CO2 24  19 - 32 mEq/L   Glucose, Bld 93  70 - 99 mg/dL   BUN 7  6 - 23 mg/dL   Creatinine, Ser 7.82  0.50 - 1.10 mg/dL   Calcium 9.1  8.4 - 95.6 mg/dL   Total Protein 7.0  6.0 - 8.3 g/dL   Albumin 3.4 (*) 3.5 - 5.2 g/dL   AST 16  0 - 37 U/L   ALT 10  0 - 35 U/L   Alkaline Phosphatase 94  39 - 117 U/L   Total Bilirubin 0.2 (*) 0.3 - 1.2 mg/dL   GFR calc non Af Amer >90  >90 mL/min   GFR calc Af Amer >90  >90 mL/min  TROPONIN I      Result Value Ref Range   Troponin I <0.30  <0.30 ng/mL  URINE RAPID DRUG SCREEN (HOSP PERFORMED)      Result Value Ref Range   Opiates POSITIVE (*) NONE DETECTED   Cocaine NONE DETECTED  NONE DETECTED   Benzodiazepines NONE DETECTED  NONE DETECTED   Amphetamines NONE DETECTED  NONE DETECTED   Tetrahydrocannabinol NONE DETECTED  NONE DETECTED   Barbiturates NONE DETECTED  NONE DETECTED   Dg Chest 2 View  11/26/2013   CLINICAL DATA:  Shortness of breath and chest pain  EXAM: CHEST  2 VIEW  COMPARISON:  None.  FINDINGS: The lungs are clear. Heart size and pulmonary vascularity are normal. No pneumothorax. No adenopathy. There is mild upper thoracic levoscoliosis.  IMPRESSION: No abnormality noted.   Electronically Signed   By: Bretta Bang M.D.   On: 11/26/2013 08:15      EKG Interpretation   Date/Time:  Monday November 26 2013 07:01:24 EST Ventricular Rate:  92 PR Interval:  180 QRS Duration: 83 QT Interval:  358 QTC Calculation: 443 R Axis:   76 Text Interpretation:  Sinus rhythm Nonspecific ST abnormality No previous  tracing Confirmed by Denton Lank  MD, Caryn Bee ([id number]) on 11/26/2013 7:12:16 AM       MDM  Iv ns. Labs. Cxr.   Reviewed nursing notes and prior charts for additional history.   Pt with reproducible chest wall pain/tenderness, reproducing her cp.  After constant symptoms > 24 hrs, trop neg. cxr neg.  Above felt most c/w musculoskeletal chest pain, and not acs.  Pt appears  stable for d/c.      Suzi Roots, MD 11/26/13 1045

## 2013-12-05 ENCOUNTER — Ambulatory Visit: Payer: Medicaid Other

## 2014-01-22 ENCOUNTER — Encounter (HOSPITAL_COMMUNITY): Payer: Self-pay | Admitting: Emergency Medicine

## 2014-01-22 ENCOUNTER — Emergency Department (HOSPITAL_COMMUNITY): Payer: Medicaid Other

## 2014-01-22 ENCOUNTER — Emergency Department (HOSPITAL_COMMUNITY)
Admission: EM | Admit: 2014-01-22 | Discharge: 2014-01-22 | Disposition: A | Discharge status: Home / Self Care | Payer: Medicaid Other | Attending: Emergency Medicine | Admitting: Emergency Medicine

## 2014-01-22 DIAGNOSIS — N949 Unspecified condition associated with female genital organs and menstrual cycle: Secondary | ICD-10-CM | POA: Insufficient documentation

## 2014-01-22 DIAGNOSIS — O9989 Other specified diseases and conditions complicating pregnancy, childbirth and the puerperium: Secondary | ICD-10-CM | POA: Insufficient documentation

## 2014-01-22 DIAGNOSIS — R0602 Shortness of breath: Secondary | ICD-10-CM | POA: Insufficient documentation

## 2014-01-22 DIAGNOSIS — Z3490 Encounter for supervision of normal pregnancy, unspecified, unspecified trimester: Secondary | ICD-10-CM

## 2014-01-22 DIAGNOSIS — R0789 Other chest pain: Secondary | ICD-10-CM | POA: Insufficient documentation

## 2014-01-22 DIAGNOSIS — R51 Headache: Secondary | ICD-10-CM | POA: Insufficient documentation

## 2014-01-22 DIAGNOSIS — Z8669 Personal history of other diseases of the nervous system and sense organs: Secondary | ICD-10-CM | POA: Insufficient documentation

## 2014-01-22 LAB — URINE MICROSCOPIC-ADD ON

## 2014-01-22 LAB — URINALYSIS, ROUTINE W REFLEX MICROSCOPIC
Glucose, UA: NEGATIVE mg/dL
Hgb urine dipstick: NEGATIVE
KETONES UR: NEGATIVE mg/dL
NITRITE: NEGATIVE
PROTEIN: 30 mg/dL — AB
Specific Gravity, Urine: 1.034 — ABNORMAL HIGH (ref 1.005–1.030)
UROBILINOGEN UA: 1 mg/dL (ref 0.0–1.0)
pH: 5.5 (ref 5.0–8.0)

## 2014-01-22 LAB — COMPREHENSIVE METABOLIC PANEL
ALBUMIN: 3.6 g/dL (ref 3.5–5.2)
ALT: 7 U/L (ref 0–35)
AST: 15 U/L (ref 0–37)
Alkaline Phosphatase: 69 U/L (ref 39–117)
BILIRUBIN TOTAL: 0.3 mg/dL (ref 0.3–1.2)
BUN: 7 mg/dL (ref 6–23)
CHLORIDE: 103 meq/L (ref 96–112)
CO2: 22 meq/L (ref 19–32)
CREATININE: 0.72 mg/dL (ref 0.50–1.10)
Calcium: 9.5 mg/dL (ref 8.4–10.5)
GLUCOSE: 86 mg/dL (ref 70–99)
Potassium: 3.7 mEq/L (ref 3.7–5.3)
Sodium: 137 mEq/L (ref 137–147)
Total Protein: 7.3 g/dL (ref 6.0–8.3)

## 2014-01-22 LAB — WET PREP, GENITAL
TRICH WET PREP: NONE SEEN
Yeast Wet Prep HPF POC: NONE SEEN

## 2014-01-22 LAB — CBC
HEMATOCRIT: 36.2 % (ref 36.0–46.0)
Hemoglobin: 11.5 g/dL — ABNORMAL LOW (ref 12.0–15.0)
MCH: 25 pg — ABNORMAL LOW (ref 26.0–34.0)
MCHC: 31.8 g/dL (ref 30.0–36.0)
MCV: 78.7 fL (ref 78.0–100.0)
Platelets: 265 10*3/uL (ref 150–400)
RBC: 4.6 MIL/uL (ref 3.87–5.11)
RDW: 14.6 % (ref 11.5–15.5)
WBC: 6.3 10*3/uL (ref 4.0–10.5)

## 2014-01-22 LAB — HCG, QUANTITATIVE, PREGNANCY: HCG, BETA CHAIN, QUANT, S: 46566 m[IU]/mL — AB (ref <5)

## 2014-01-22 LAB — POC URINE PREG, ED: Preg Test, Ur: POSITIVE — AB

## 2014-01-22 LAB — TROPONIN I

## 2014-01-22 MED ORDER — NITROFURANTOIN MONOHYD MACRO 100 MG PO CAPS: 100.0000 mg | ORAL_CAPSULE | Freq: Two times a day (BID) | ORAL | Status: DC

## 2014-01-22 MED ORDER — PRENATAL COMPLETE 14-0.4 MG PO TABS: 1.0000 | ORAL_TABLET | Freq: Every day | ORAL | Status: DC

## 2014-01-22 MED ORDER — ACETAMINOPHEN 325 MG PO TABS
650.0000 mg | ORAL_TABLET | Freq: Once | ORAL | Status: AC
  Administered 2014-01-22: 650 mg via ORAL
  Filled 2014-01-22: qty 2

## 2014-01-22 NOTE — ED Notes (Signed)
Pt alert x4 respirations easy non labored skin warm dry

## 2014-01-22 NOTE — ED Notes (Signed)
States has had cp x 1 week was seen for same last month for same  States pelvis chest and rt side hurts

## 2014-01-22 NOTE — ED Provider Notes (Signed)
RC this patient at check out from [employer] [place], [place]. Briefly, this is a 23 y.o. female with newly diagnosed pregnancy. Plan is to order ultrasound of the pelvis, ensure intrauterine pregnancy, rule out ectopic pregnancy.  If within normal limits, will discharge on antibiotics for asymptomatic bacteriuria, as she is pregnant.  Ultrasound reveals single intrauterine gestation with embryo and normal cardiac activity. Estimated gestational age by crown-rump length equals 8 weeks 4 days.  No abnormal findings and adnexa.  Patient has asymptomatic bacteriuria. I will discharge her on nitrofurantoin, prenatal vitamins, instruct her to followup with prenatal care as soon as possible.  Pt stable for discharge, FU.  All questions answered.  Return precautions given.  I have discussed case and care has been guided by my attending physician, Dr. Manus Gunningancour.  Loma BostonStirling Malikye Reppond, MD 01/23/14 0201

## 2014-01-22 NOTE — ED Provider Notes (Signed)
CSN: [id number]     Arrival date & time 01/22/14  1214 History   First MD Initiated Contact with Patient 01/22/14 1345     Chief Complaint  Patient presents with  . Chest Pain     (Consider location/radiation/quality/duration/timing/severity/associated sxs/prior Treatment) HPI Jasmin Wilson is a 23 y.o. female who presents emergency department multiple complaints. Patient states she developed epigastric abdominal/central chest pain approximately week ago. States also developed some pelvic pain at that time. She reports prior history of similar pain. She describes pain as sharp. She denies any associated shortness of breath. No fever or chills. No cough. She denies any pleuritic component to her pain. Patient states that her pelvic pain is constant, no urinary or vaginal symptoms. Last period was 4 months ago, states this is unusual for her but she denies being pregnant. Patient also reports headache that began yesterday. She's been taking Avapro Phenergan Advil for his symptoms with no relief. Denies any prior heart or lung issues. She denies any prior pregnancies. Nothing that she does makes her pain better or worse. She reports last sexual intercourse 4 months ago.  Past Medical History  Diagnosis Date  . Seizures    History reviewed. No pertinent past surgical history. No family history on file. History  Substance Use Topics  . Smoking status: Never Smoker   . Smokeless tobacco: Not on file  . Alcohol Use: No   OB History   Grav Para Term Preterm Abortions TAB SAB Ect Mult Living                 Review of Systems  Constitutional: Negative for fever and chills.  Respiratory: Positive for chest tightness. Negative for cough and shortness of breath.   Cardiovascular: Positive for chest pain. Negative for palpitations and leg swelling.  Gastrointestinal: Positive for abdominal pain. Negative for nausea, vomiting and diarrhea.  Genitourinary: Positive for pelvic pain. Negative for  dysuria, flank pain, vaginal bleeding, vaginal discharge and vaginal pain.  Musculoskeletal: Negative for arthralgias, myalgias, neck pain and neck stiffness.  Skin: Negative for rash.  Neurological: Positive for headaches. Negative for dizziness, weakness and light-headedness.  All other systems reviewed and are negative.     Allergies  Review of patient's allergies indicates no known allergies.  Home Medications   Prior to Admission medications   Medication Sig Start Date End Date Taking? Authorizing Provider  Acetaminophen (TYLENOL PO) Take 1 tablet by mouth every 4 (four) hours as needed.   Yes Historical Provider, MD  ibuprofen (ADVIL,MOTRIN) 200 MG tablet Take 400 mg by mouth every 6 (six) hours as needed for moderate pain.   Yes Historical Provider, MD   BP 124/65  Pulse 89  Temp(Src) 98.2 F (36.8 C)  Resp 18  SpO2 99%  LMP 09/18/2013 Physical Exam  Nursing note and vitals reviewed. Constitutional: She appears well-developed and well-nourished. No distress.  HENT:  Head: Normocephalic.  Eyes: Conjunctivae and EOM are normal. Pupils are equal, round, and reactive to light.  Neck: Neck supple.  Cardiovascular: Normal rate, regular rhythm and normal heart sounds.   Pulmonary/Chest: Effort normal and breath sounds normal. No respiratory distress. She has no wheezes. She has no rales. She exhibits no tenderness.  Abdominal: Soft. Bowel sounds are normal. She exhibits no distension. There is tenderness. There is no rebound.  Mild epigastric tenderness.  Genitourinary:  Normal external genitalia. Small thin vaginal discharge. No cervical motion tenderness. No adnexal uterine tenderness.  Musculoskeletal: She exhibits no edema.  Neurological: She is alert.  Skin: Skin is warm and dry.  Psychiatric: She has a normal mood and affect. Her behavior is normal.    ED Course  Procedures (including critical care time) Labs Review Labs Reviewed  WET PREP, GENITAL - Abnormal;  Notable for the following:    Clue Cells Wet Prep HPF POC FEW (*)    WBC, Wet Prep HPF POC MODERATE (*)    All other components within normal limits  CBC - Abnormal; Notable for the following:    Hemoglobin 11.5 (*)    MCH 25.0 (*)    All other components within normal limits  URINALYSIS, ROUTINE W REFLEX MICROSCOPIC - Abnormal; Notable for the following:    Color, Urine AMBER (*)    APPearance CLOUDY (*)    Specific Gravity, Urine 1.034 (*)    Bilirubin Urine SMALL (*)    Protein, ur 30 (*)    Leukocytes, UA LARGE (*)    All other components within normal limits  URINE MICROSCOPIC-ADD ON - Abnormal; Notable for the following:    Squamous Epithelial / LPF MANY (*)    Bacteria, UA MANY (*)    All other components within normal limits  HCG, QUANTITATIVE, PREGNANCY - Abnormal; Notable for the following:    hCG, Beta Chain, Quant, S [id number] (*)    All other components within normal limits  POC URINE PREG, ED - Abnormal; Notable for the following:    Preg Test, Ur POSITIVE (*)    All other components within normal limits  GC/CHLAMYDIA PROBE AMP  COMPREHENSIVE METABOLIC PANEL  TROPONIN I    Imaging Review Dg Chest 2 View  01/22/2014   CLINICAL DATA:  CHEST PAIN  EXAM: CHEST  2 VIEW  COMPARISON:  Chest radiograph 11/26/2013  FINDINGS: Normal mediastinum and cardiac silhouette. Normal pulmonary vasculature. No evidence of effusion, infiltrate, or pneumothorax. No acute bony abnormality.  IMPRESSION: Normal chest radiograph   Electronically Signed   By: Genevive BiStewart  Edmunds M.D.   On: 01/22/2014 13:26     EKG Interpretation None      MDM   Final diagnoses:  Pregnancy    Patient appears to be in no distress. Certainly there is no concern for any intracranial hemorrhages, I doubt she has a PE given normal vital signs, no concern for ACS, no risk factors. Chest x-ray is negative. Labs unremarkable. Urine pregnancy came back positive. This was a surprise to the patient and she states that  last time she had intercourse was 4 months ago. Patient's exam not consistent with four-month gestation. She denies any vaginal bleeding. Will get an ultrasound for further evaluation.   4:30 PM Pt signed out at shift change.     Lottie Musselatyana A [facility], PA-C 01/24/14 [phone]

## 2014-01-22 NOTE — Discharge Instructions (Signed)

## 2014-01-23 LAB — GC/CHLAMYDIA PROBE AMP
CT Probe RNA: NEGATIVE
GC Probe RNA: NEGATIVE

## 2014-01-23 NOTE — ED Provider Notes (Signed)
I saw and evaluated the patient, reviewed the resident's note and I agree with the findings and plan. If applicable, I agree with the resident's interpretation of the EKG.  If applicable, I was present for critical portions of any procedures performed.    Glynn OctaveStephen Chanae Gemma, MD 01/23/14 [phone]

## 2014-01-25 NOTE — ED Provider Notes (Signed)
Medical screening examination/treatment/procedure(s) were performed by non-physician practitioner and as supervising physician I was immediately available for consultation/collaboration.   EKG Interpretation   Date/Time:  Tuesday January 22 2014 12:17:12 EDT Ventricular Rate:  93 PR Interval:  150 QRS Duration: 84 QT Interval:  336 QTC Calculation: 417 R Axis:   66 Text Interpretation:  Normal sinus rhythm with sinus arrhythmia  Nonspecific T wave abnormality Abnormal ECG ED PHYSICIAN INTERPRETATION  AVAILABLE IN [facility] Confirmed by TEST, Record ([id number]) on  01/24/2014 7:29:48 AM       Shon Batonourtney F Horton, MD 01/25/14 1932

## 2014-01-25 NOTE — Progress Notes (Signed)
[facility] requesting a read back of prescribed discharge medications for clarification. EPIC read completed. No further questions. Ferdinand CavaAndrea Schettino, RN, BSN, Case Managers 01/25/2014 1:04 PM

## 2014-02-15 LAB — OB RESULTS CONSOLE RUBELLA ANTIBODY, IGM: Rubella: IMMUNE

## 2014-02-15 LAB — OB RESULTS CONSOLE HEPATITIS B SURFACE ANTIGEN: Hepatitis B Surface Ag: NEGATIVE

## 2014-02-15 LAB — OB RESULTS CONSOLE ABO/RH: RH TYPE: POSITIVE

## 2014-02-15 LAB — OB RESULTS CONSOLE HIV ANTIBODY (ROUTINE TESTING): HIV: NONREACTIVE

## 2014-02-15 LAB — OB RESULTS CONSOLE RPR: RPR: NONREACTIVE

## 2014-02-15 LAB — OB RESULTS CONSOLE GC/CHLAMYDIA
Chlamydia: NEGATIVE
GC PROBE AMP, GENITAL: NEGATIVE

## 2014-03-20 ENCOUNTER — Encounter (HOSPITAL_COMMUNITY): Payer: Self-pay | Admitting: *Deleted

## 2014-03-20 ENCOUNTER — Emergency Department (HOSPITAL_COMMUNITY)
Admission: EM | Admit: 2014-03-20 | Discharge: 2014-03-20 | Discharge status: Against Medical Advice | Payer: Medicaid Other

## 2014-03-20 ENCOUNTER — Inpatient Hospital Stay (HOSPITAL_COMMUNITY)
Admission: AD | Admit: 2014-03-20 | Discharge: 2014-03-20 | Disposition: A | Discharge status: Home / Self Care | Payer: Medicaid Other | Source: Ambulatory Visit | Attending: Obstetrics and Gynecology | Admitting: Obstetrics and Gynecology

## 2014-03-20 DIAGNOSIS — O219 Vomiting of pregnancy, unspecified: Secondary | ICD-10-CM

## 2014-03-20 DIAGNOSIS — O210 Mild hyperemesis gravidarum: Secondary | ICD-10-CM | POA: Insufficient documentation

## 2014-03-20 DIAGNOSIS — Z833 Family history of diabetes mellitus: Secondary | ICD-10-CM | POA: Insufficient documentation

## 2014-03-20 DIAGNOSIS — R109 Unspecified abdominal pain: Secondary | ICD-10-CM | POA: Insufficient documentation

## 2014-03-20 LAB — URINE MICROSCOPIC-ADD ON

## 2014-03-20 LAB — URINALYSIS, ROUTINE W REFLEX MICROSCOPIC
Bilirubin Urine: NEGATIVE
Glucose, UA: NEGATIVE mg/dL
Hgb urine dipstick: NEGATIVE
KETONES UR: 15 mg/dL — AB
Nitrite: NEGATIVE
PROTEIN: NEGATIVE mg/dL
Specific Gravity, Urine: 1.02 (ref 1.005–1.030)
Urobilinogen, UA: 1 mg/dL (ref 0.0–1.0)
pH: 7 (ref 5.0–8.0)

## 2014-03-20 MED ORDER — PROMETHAZINE HCL 25 MG PO TABS: 25.0000 mg | ORAL_TABLET | Freq: Four times a day (QID) | ORAL | Status: DC | PRN

## 2014-03-20 MED ORDER — PROMETHAZINE HCL 25 MG/ML IJ SOLN
Freq: Once | INTRAVENOUS | Status: AC
  Administered 2014-03-20: 14:00:00 via INTRAVENOUS
  Filled 2014-03-20: qty 1000

## 2014-03-20 NOTE — MAU Provider Note (Signed)
History     CSN: [id number]  Arrival date and time: 03/20/14 1252   First Provider Initiated Contact with Patient 03/20/14 1346      Chief Complaint  Patient presents with  . Abdominal Pain  . Emesis During Pregnancy   HPI Comments: Jasmin Wilson 23 y.o. G1P0 [id number] presents to MAU with Vomiting x 1 week and abdominal cramping. She has no nausea medication. She had diet of fried food. She has had her NOB visit.   Abdominal Pain Associated symptoms include nausea and vomiting.      Past Medical History  Diagnosis Date  . Seizures     Past Surgical History  Procedure Laterality Date  . No past surgeries      Family History  Problem Relation Age of Onset  . Diabetes Maternal Grandmother   . Hypertension Maternal Grandmother     History  Substance Use Topics  . Smoking status: Never Smoker   . Smokeless tobacco: Not on file  . Alcohol Use: No    Allergies: No Known Allergies  No prescriptions prior to admission    Review of Systems  Constitutional: Negative.   HENT: Negative.   Eyes: Negative.   Respiratory: Negative.   Cardiovascular: Negative.   Gastrointestinal: Positive for nausea, vomiting and abdominal pain.  Genitourinary: Negative.   Musculoskeletal: Negative.   Skin: Negative.   Neurological: Negative.   Psychiatric/Behavioral: Negative.    Physical Exam   Blood pressure 125/65, pulse 82, temperature 98.3 F (36.8 C), temperature source Oral, resp. rate 18, height 5\' 7"  (1.702 m), weight 78.019 kg (172 lb), last menstrual period 09/18/2013, SpO2 100.00%.  Physical Exam  Constitutional: She is oriented to person, place, and time. She appears well-developed and well-nourished. No distress.  HENT:  Head: Normocephalic and atraumatic.  Eyes: Conjunctivae are normal. Pupils are equal, round, and reactive to light.  Cardiovascular: Normal rate, regular rhythm and normal heart sounds.   Respiratory: Effort normal and breath sounds normal.  GI:  Soft. Bowel sounds are normal.  Genitourinary:  Not examined  Musculoskeletal: Normal range of motion.  Neurological: She is alert and oriented to person, place, and time.  Skin: Skin is warm and dry.  Psychiatric: She has a normal mood and affect. Her behavior is normal. Judgment and thought content normal.   Results for orders placed during the hospital encounter of 03/20/14 (from the past 24 hour(s))  URINALYSIS, ROUTINE W REFLEX MICROSCOPIC     Status: Abnormal   Collection Time    03/20/14  1:05 PM      Result Value Ref Range   Color, Urine YELLOW  YELLOW   APPearance HAZY (*) CLEAR   Specific Gravity, Urine 1.020  1.005 - 1.030   pH 7.0  5.0 - 8.0   Glucose, UA NEGATIVE  NEGATIVE mg/dL   Hgb urine dipstick NEGATIVE  NEGATIVE   Bilirubin Urine NEGATIVE  NEGATIVE   Ketones, ur 15 (*) NEGATIVE mg/dL   Protein, ur NEGATIVE  NEGATIVE mg/dL   Urobilinogen, UA 1.0  0.0 - 1.0 mg/dL   Nitrite NEGATIVE  NEGATIVE   Leukocytes, UA MODERATE (*) NEGATIVE  URINE MICROSCOPIC-ADD ON     Status: Abnormal   Collection Time    03/20/14  1:05 PM      Result Value Ref Range   Squamous Epithelial / LPF MANY (*) RARE   WBC, UA 3-6  <3 WBC/hpf   Bacteria, UA MANY (*) RARE   Urine-Other MUCOUS PRESENT  MAU Course  Procedures  MDM IV LR with phenergan 25 mg  Assessment and Plan   A: Nausea and Vomiting in pregnancy  P: Phenergan 25 mg po q6 hours prn nausea Advised in diet choices Stay hydrated [facility]   Carolynn ServeBarefoot, Linda Miller 03/20/2014, 1:47 PM

## 2014-03-20 NOTE — MAU Note (Signed)
C/o intermittent N&V for past week with constant sharp cramping in lower abdomen; rates pain @ 9;  FHR dopplered @ 154;

## 2014-03-20 NOTE — ED Notes (Signed)
Pt stated that she needed to go and left.

## 2014-03-20 NOTE — Discharge Instructions (Signed)
Morning Sickness Morning sickness is when you feel sick to your stomach (nauseous) during pregnancy. This nauseous feeling may or may not come with vomiting. It often occurs in the morning but can be a problem any time of day. Morning sickness is most common during the first trimester, but it may continue throughout pregnancy. While morning sickness is unpleasant, it is usually harmless unless you develop severe and continual vomiting (hyperemesis gravidarum). This condition requires more intense treatment.  CAUSES  The cause of morning sickness is not completely known but seems to be related to normal hormonal changes that occur in pregnancy. RISK FACTORS You are at greater risk if you:  Experienced nausea or vomiting before your pregnancy.  Had morning sickness during a previous pregnancy.  Are pregnant with more than one baby, such as twins. TREATMENT  Do not use any medicines (prescription, over-the-counter, or herbal) for morning sickness without first talking to your health care provider. Your health care provider may prescribe or recommend:  Vitamin B6 supplements.  Anti-nausea medicines.  The herbal medicine ginger. HOME CARE INSTRUCTIONS   Only take over-the-counter or prescription medicines as directed by your health care provider.  Taking multivitamins before getting pregnant can prevent or decrease the severity of morning sickness in most women.  Eat a piece of dry toast or unsalted crackers before getting out of bed in the morning.  Eat five or six small meals a day.  Eat dry and bland foods (rice, baked potato). Foods high in carbohydrates are often helpful.  Do not drink liquids with your meals. Drink liquids between meals.  Avoid greasy, fatty, and spicy foods.  Get someone to cook for you if the smell of any food causes nausea and vomiting.  If you feel nauseous after taking prenatal vitamins, take the vitamins at night or with a snack.  Snack on protein  foods (nuts, yogurt, cheese) between meals if you are hungry.  Eat unsweetened gelatins for desserts.  Wearing an acupressure wristband (worn for sea sickness) may be helpful.  Acupuncture may be helpful.  Do not smoke.  Get a humidifier to keep the air in your house free of odors.  Get plenty of fresh air. SEEK MEDICAL CARE IF:   Your home remedies are not working, and you need medicine.  You feel dizzy or lightheaded.  You are losing weight. SEEK IMMEDIATE MEDICAL CARE IF:   You have persistent and uncontrolled nausea and vomiting.  You pass out (faint). MAKE SURE YOU:  Understand these instructions.  Will watch your condition.  Will get help right away if you are not doing well or get worse. Document Released: 11/04/2006 Document Revised: 09/18/2013 Document Reviewed: 02/28/2013 [facility] Patient Information 2015 [place], [place]. This information is not intended to replace advice given to you by your health care provider. Make sure you discuss any questions you have with your health care provider. Food Choices to Help Relieve Diarrhea When you have diarrhea, the foods you eat and your eating habits are very important. Choosing the right foods and drinks can help relieve diarrhea. Also, because diarrhea can last up to 7 days, you need to replace lost fluids and electrolytes (such as sodium, potassium, and chloride) in order to help prevent dehydration.  WHAT GENERAL GUIDELINES DO I NEED TO FOLLOW?  Slowly drink 1 cup (8 oz) of fluid for each episode of diarrhea. If you are getting enough fluid, your urine will be clear or pale yellow.  Eat starchy foods. Some good choices include  white rice, white toast, pasta, low-fiber cereal, baked potatoes (without the skin), saltine crackers, and bagels.  Avoid large servings of any cooked vegetables.  Limit fruit to two servings per day. A serving is  cup or 1 small piece.  Choose foods with less than 2 g of fiber per  serving.  Limit fats to less than 8 tsp (38 g) per day.  Avoid fried foods.  Eat foods that have probiotics in them. Probiotics can be found in certain dairy products.  Avoid foods and beverages that may increase the speed at which food moves through the stomach and intestines (gastrointestinal tract). Things to avoid include:  High-fiber foods, such as dried fruit, raw fruits and vegetables, nuts, seeds, and whole grain foods.  Spicy foods and high-fat foods.  Foods and beverages sweetened with high-fructose corn syrup, honey, or sugar alcohols such as xylitol, sorbitol, and mannitol. WHAT FOODS ARE RECOMMENDED? Grains White rice. White, [place], or pita breads (fresh or toasted), including plain rolls, buns, or bagels. White pasta. Saltine, soda, or graham crackers. Pretzels. Low-fiber cereal. Cooked cereals made with water (such as cornmeal, farina, or cream cereals). Plain muffins. Matzo. Melba toast. Zwieback.  Vegetables Potatoes (without the skin). Strained tomato and vegetable juices. Most well-cooked and canned vegetables without seeds. Tender lettuce. Fruits Cooked or canned applesauce, apricots, cherries, fruit cocktail, grapefruit, peaches, pears, or plums. Fresh bananas, apples without skin, cherries, grapes, cantaloupe, grapefruit, peaches, oranges, or plums.  Meat and Other Protein Products Baked or boiled chicken. Eggs. Tofu. Fish. Seafood. Smooth peanut butter. Ground or well-cooked tender beef, ham, veal, lamb, pork, or poultry.  Dairy Plain yogurt, kefir, and unsweetened liquid yogurt. Lactose-free milk, buttermilk, or soy milk. Plain hard cheese. Beverages Sport drinks. Clear broths. Diluted fruit juices (except prune). Regular, caffeine-free sodas such as ginger ale. Water. Decaffeinated teas. Oral rehydration solutions. Sugar-free beverages not sweetened with sugar alcohols. Other Bouillon, broth, or soups made from recommended foods.  The items listed above may not  be a complete list of recommended foods or beverages. Contact your dietitian for more options. WHAT FOODS ARE NOT RECOMMENDED? Grains Whole grain, whole wheat, bran, or rye breads, rolls, pastas, crackers, and cereals. Wild or brown rice. Cereals that contain more than 2 g of fiber per serving. Corn tortillas or taco shells. Cooked or dry oatmeal. Granola. Popcorn. Vegetables Raw vegetables. Cabbage, broccoli, Brussels sprouts, artichokes, baked beans, beet greens, corn, kale, legumes, peas, sweet potatoes, and yams. Potato skins. Cooked spinach and cabbage. Fruits Dried fruit, including raisins and dates. Raw fruits. Stewed or dried prunes. Fresh apples with skin, apricots, mangoes, pears, raspberries, and strawberries.  Meat and Other Protein Products Chunky peanut butter. Nuts and seeds. Beans and lentils. Tomasa BlaseBacon.  Dairy High-fat cheeses. Milk, chocolate milk, and beverages made with milk, such as milk shakes. Cream. Ice cream. Sweets and Desserts Sweet rolls, doughnuts, and sweet breads. Pancakes and waffles. Fats and Oils Butter. Cream sauces. Margarine. Salad oils. Plain salad dressings. Olives. Avocados.  Beverages Caffeinated beverages (such as coffee, tea, soda, or energy drinks). Alcoholic beverages. Fruit juices with pulp. Prune juice. Soft drinks sweetened with high-fructose corn syrup or sugar alcohols. Other Coconut. Hot sauce. Chili powder. Mayonnaise. Gravy. Cream-based or milk-based soups.  The items listed above may not be a complete list of foods and beverages to avoid. Contact your dietitian for more information. WHAT SHOULD I DO IF I BECOME DEHYDRATED? Diarrhea can sometimes lead to dehydration. Signs of dehydration include dark urine and dry mouth  and skin. If you think you are dehydrated, you should rehydrate with an oral rehydration solution. These solutions can be purchased at pharmacies, retail stores, or online.  Drink -1 cup (120-240 mL) of oral rehydration solution  each time you have an episode of diarrhea. If drinking this amount makes your diarrhea worse, try drinking smaller amounts more often. For example, drink 1-3 tsp (5-15 mL) every 5-10 minutes.  A general rule for staying hydrated is to drink 1-2 L of fluid per day. Talk to your health care provider about the specific amount you should be drinking each day. Drink enough fluids to keep your urine clear or pale yellow. Document Released: 12/04/2003 Document Revised: 09/18/2013 Document Reviewed: 08/06/2013 [facility]Exit[facility] Patient Information 2015 [place]ExitCare, [place]LL[place]. This information is not intended to replace advice given to you by your health care provider. Make sure you discuss any questions you have with your health care provider.

## 2014-03-20 NOTE — MAU Note (Signed)
Sharp lower abd pain x 3 days, vomiting for the last week.  Denies bleeding.

## 2014-05-27 ENCOUNTER — Inpatient Hospital Stay (HOSPITAL_COMMUNITY): Payer: Medicaid Other

## 2014-05-27 ENCOUNTER — Inpatient Hospital Stay (HOSPITAL_COMMUNITY)
Admission: AD | Admit: 2014-05-27 | Discharge: 2014-05-27 | Disposition: A | Discharge status: Home / Self Care | Payer: Medicaid Other | Source: Ambulatory Visit | Attending: Obstetrics and Gynecology | Admitting: Obstetrics and Gynecology

## 2014-05-27 ENCOUNTER — Encounter (HOSPITAL_COMMUNITY): Payer: Self-pay | Admitting: *Deleted

## 2014-05-27 DIAGNOSIS — IMO0002 Reserved for concepts with insufficient information to code with codable children: Secondary | ICD-10-CM

## 2014-05-27 DIAGNOSIS — R42 Dizziness and giddiness: Secondary | ICD-10-CM | POA: Insufficient documentation

## 2014-05-27 DIAGNOSIS — R51 Headache: Secondary | ICD-10-CM

## 2014-05-27 DIAGNOSIS — O9989 Other specified diseases and conditions complicating pregnancy, childbirth and the puerperium: Secondary | ICD-10-CM

## 2014-05-27 DIAGNOSIS — Y92009 Unspecified place in unspecified non-institutional (private) residence as the place of occurrence of the external cause: Secondary | ICD-10-CM | POA: Insufficient documentation

## 2014-05-27 DIAGNOSIS — O99891 Other specified diseases and conditions complicating pregnancy: Secondary | ICD-10-CM | POA: Diagnosis not present

## 2014-05-27 DIAGNOSIS — R296 Repeated falls: Secondary | ICD-10-CM | POA: Diagnosis not present

## 2014-05-27 DIAGNOSIS — R519 Headache, unspecified: Secondary | ICD-10-CM

## 2014-05-27 DIAGNOSIS — T1490XA Injury, unspecified, initial encounter: Secondary | ICD-10-CM

## 2014-05-27 DIAGNOSIS — R198 Other specified symptoms and signs involving the digestive system and abdomen: Secondary | ICD-10-CM

## 2014-05-27 DIAGNOSIS — O26892 Other specified pregnancy related conditions, second trimester: Secondary | ICD-10-CM

## 2014-05-27 LAB — URINALYSIS, ROUTINE W REFLEX MICROSCOPIC
BILIRUBIN URINE: NEGATIVE
Glucose, UA: NEGATIVE mg/dL
Hgb urine dipstick: NEGATIVE
Ketones, ur: NEGATIVE mg/dL
Nitrite: NEGATIVE
PH: 6 (ref 5.0–8.0)
Protein, ur: NEGATIVE mg/dL
SPECIFIC GRAVITY, URINE: 1.02 (ref 1.005–1.030)
Urobilinogen, UA: 1 mg/dL (ref 0.0–1.0)

## 2014-05-27 LAB — TROPONIN I: Troponin I: <0.3 ng/mL (ref <0.30)

## 2014-05-27 LAB — URINE MICROSCOPIC-ADD ON

## 2014-05-27 MED ORDER — ACETAMINOPHEN 500 MG PO TABS
1000.0000 mg | ORAL_TABLET | ORAL | Status: AC
  Administered 2014-05-27: 1000 mg via ORAL
  Filled 2014-05-27: qty 2

## 2014-05-27 NOTE — Discharge Instructions (Signed)
Third Trimester of Pregnancy The third trimester is from week 29 through week 42, months 7 through 9. The third trimester is a time when the fetus is growing rapidly. At the end of the ninth month, the fetus is about 20 inches in length and weighs 6-10 pounds.  BODY CHANGES Your body goes through many changes during pregnancy. The changes vary from woman to woman.   Your weight will continue to increase. You can expect to gain 25-35 pounds (11-16 kg) by the end of the pregnancy.  You may begin to get stretch marks on your hips, abdomen, and breasts.  You may urinate more often because the fetus is moving lower into your pelvis and pressing on your bladder.  You may develop or continue to have heartburn as a result of your pregnancy.  You may develop constipation because certain hormones are causing the muscles that push waste through your intestines to slow down.  You may develop hemorrhoids or swollen, bulging veins (varicose veins).  You may have pelvic pain because of the weight gain and pregnancy hormones relaxing your joints between the bones in your pelvis. Backaches may result from overexertion of the muscles supporting your posture.  You may have changes in your hair. These can include thickening of your hair, rapid growth, and changes in texture. Some women also have hair loss during or after pregnancy, or hair that feels dry or thin. Your hair will most likely return to normal after your baby is born.  Your breasts will continue to grow and be tender. A yellow discharge may leak from your breasts called colostrum.  Your belly button may stick out.  You may feel short of breath because of your expanding uterus.  You may notice the fetus "dropping," or moving lower in your abdomen.  You may have a bloody mucus discharge. This usually occurs a few days to a week before labor begins.  Your cervix becomes thin and soft (effaced) near your due date. WHAT TO EXPECT AT YOUR PRENATAL  EXAMS  You will have prenatal exams every 2 weeks until week 36. Then, you will have weekly prenatal exams. During a routine prenatal visit:  You will be weighed to make sure you and the fetus are growing normally.  Your blood pressure is taken.  Your abdomen will be measured to track your baby's growth.  The fetal heartbeat will be listened to.  Any test results from the previous visit will be discussed.  You may have a cervical check near your due date to see if you have effaced. At around 36 weeks, your caregiver will check your cervix. At the same time, your caregiver will also perform a test on the secretions of the vaginal tissue. This test is to determine if a type of bacteria, Group B streptococcus, is present. Your caregiver will explain this further. Your caregiver may ask you:  What your birth plan is.  How you are feeling.  If you are feeling the baby move.  If you have had any abnormal symptoms, such as leaking fluid, bleeding, severe headaches, or abdominal cramping.  If you have any questions. Other tests or screenings that may be performed during your third trimester include:  Blood tests that check for low iron levels (anemia).  Fetal testing to check the health, activity level, and growth of the fetus. Testing is done if you have certain medical conditions or if there are problems during the pregnancy. FALSE LABOR You may feel small, irregular contractions that   eventually go away. These are called Braxton Hicks contractions, or false labor. Contractions may last for hours, days, or even weeks before true labor sets in. If contractions come at regular intervals, intensify, or become painful, it is best to be seen by your caregiver.  SIGNS OF LABOR   Menstrual-like cramps.  Contractions that are 5 minutes apart or less.  Contractions that start on the top of the uterus and spread down to the lower abdomen and back.  A sense of increased pelvic pressure or back  pain.  A watery or bloody mucus discharge that comes from the vagina. If you have any of these signs before the 37th week of pregnancy, call your caregiver right away. You need to go to the hospital to get checked immediately. HOME CARE INSTRUCTIONS   Avoid all smoking, herbs, alcohol, and unprescribed drugs. These chemicals affect the formation and growth of the baby.  Follow your caregiver's instructions regarding medicine use. There are medicines that are either safe or unsafe to take during pregnancy.  Exercise only as directed by your caregiver. Experiencing uterine cramps is a good sign to stop exercising.  Continue to eat regular, healthy meals.  Wear a good support bra for breast tenderness.  Do not use hot tubs, steam rooms, or saunas.  Wear your seat belt at all times when driving.  Avoid raw meat, uncooked cheese, cat litter boxes, and soil used by cats. These carry germs that can cause birth defects in the baby.  Take your prenatal vitamins.  Try taking a stool softener (if your caregiver approves) if you develop constipation. Eat more high-fiber foods, such as fresh vegetables or fruit and whole grains. Drink plenty of fluids to keep your urine clear or pale yellow.  Take warm sitz baths to soothe any pain or discomfort caused by hemorrhoids. Use hemorrhoid cream if your caregiver approves.  If you develop varicose veins, wear support hose. Elevate your feet for 15 minutes, 3-4 times a day. Limit salt in your diet.  Avoid heavy lifting, wear low heal shoes, and practice good posture.  Rest a lot with your legs elevated if you have leg cramps or low back pain.  Visit your dentist if you have not gone during your pregnancy. Use a soft toothbrush to brush your teeth and be gentle when you floss.  A sexual relationship may be continued unless your caregiver directs you otherwise.  Do not travel far distances unless it is absolutely necessary and only with the approval  of your caregiver.  Take prenatal classes to understand, practice, and ask questions about the labor and delivery.  Make a trial run to the hospital.  Pack your hospital bag.  Prepare the baby's nursery.  Continue to go to all your prenatal visits as directed by your caregiver. SEEK MEDICAL CARE IF:  You are unsure if you are in labor or if your water has broken.  You have dizziness.  You have mild pelvic cramps, pelvic pressure, or nagging pain in your abdominal area.  You have persistent nausea, vomiting, or diarrhea.  You have a bad smelling vaginal discharge.  You have pain with urination. SEEK IMMEDIATE MEDICAL CARE IF:   You have a fever.  You are leaking fluid from your vagina.  You have spotting or bleeding from your vagina.  You have severe abdominal cramping or pain.  You have rapid weight loss or gain.  You have shortness of breath with chest pain.  You notice sudden or extreme swelling   of your face, hands, ankles, feet, or legs.  You have not felt your baby move in over an hour.  You have severe headaches that do not go away with medicine.  You have vision changes. Document Released: 09/07/2001 Document Revised: 09/18/2013 Document Reviewed: 11/14/2012 [employer] Patient Information 2015 [employer]. This information is not intended to replace advice given to you by your health care provider. Make sure you discuss any questions you have with your health care provider.  

## 2014-05-27 NOTE — MAU Provider Note (Signed)
Chief Complaint:  Headache, Shortness of Breath and Chest Pain   First Provider Initiated Contact with Patient 05/27/14 1937      HPI: Jasmin Wilson is a 23 y.o. G1P0 at 28w3dwho presents to maternity admissions reporting headache since this morning, a fall today while doing laundry "my legs just gave out under me", and chest pain this afternoon.  She reports the chest pain has resolved but the headache continues.  She is ambulatory in MAU without difficulty.  She believes she hit the right side of her abdomen when she fell and does report some soreness there.  She reports good fetal movement, denies LOF, vaginal bleeding, vaginal itching/burning, urinary symptoms, dizziness, n/v, or fever/chills.  .   Past Medical History: Past Medical History  Diagnosis Date  . Seizures     as a child    Past obstetric history: OB History  Gravida Para Term Preterm AB SAB TAB Ectopic Multiple Living  1             # Outcome Date GA Lbr Len/2nd Weight Sex Delivery Anes PTL Lv  1 CUR               Past Surgical History: Past Surgical History  Procedure Laterality Date  . No past surgeries      Family History: Family History  Problem Relation Age of Onset  . Diabetes Maternal Grandmother   . Hypertension Maternal Grandmother     Social History: History  Substance Use Topics  . Smoking status: Never Smoker   . Smokeless tobacco: Not on file  . Alcohol Use: No    Allergies: No Known Allergies  Meds:  Prescriptions prior to admission  Medication Sig Dispense Refill  . Prenatal Vit-Fe Fumarate-FA (PRENATAL MULTIVITAMIN) TABS tablet Take 1 tablet by mouth daily at 12 noon.        ROS: Pertinent findings in history of present illness.  Physical Exam  Blood pressure 116/69, pulse 68, temperature 98.5 F (36.9 C), temperature source Oral, resp. rate 18, last menstrual period 09/18/2013, SpO2 98.00%. GENERAL: Well-developed, well-nourished female in no acute distress.  HEENT:  normocephalic HEART: normal rate RESP: normal effort ABDOMEN: Soft, non-tender, gravid appropriate for gestational age EXTREMITIES: Nontender, no edema NEURO: alert and oriented     FHT:  Baseline 145 , moderate variability, accelerations present, no decelerations Contractions: None on toco or to palpation   Labs: Results for orders placed during the hospital encounter of 05/27/14 (from the past 24 hour(s))  URINALYSIS, ROUTINE W REFLEX MICROSCOPIC     Status: Abnormal   Collection Time    05/27/14  6:20 PM      Result Value Ref Range   Color, Urine YELLOW  YELLOW   APPearance CLEAR  CLEAR   Specific Gravity, Urine 1.020  1.005 - 1.030   pH 6.0  5.0 - 8.0   Glucose, UA NEGATIVE  NEGATIVE mg/dL   Hgb urine dipstick NEGATIVE  NEGATIVE   Bilirubin Urine NEGATIVE  NEGATIVE   Ketones, ur NEGATIVE  NEGATIVE mg/dL   Protein, ur NEGATIVE  NEGATIVE mg/dL   Urobilinogen, UA 1.0  0.0 - 1.0 mg/dL   Nitrite NEGATIVE  NEGATIVE   Leukocytes, UA MODERATE (*) NEGATIVE  URINE MICROSCOPIC-ADD ON     Status: Abnormal   Collection Time    05/27/14  6:20 PM      Result Value Ref Range   Squamous Epithelial / LPF MANY (*) RARE   WBC, UA 3-6  <3  WBC/hpf   Bacteria, UA MANY (*) RARE  TROPONIN I     Status: None   Collection Time    05/27/14  8:55 PM      Result Value Ref Range   Troponin I <0.30  <0.30 ng/mL   [place]: Breech, cervix 3.1cm, no abruption or previa identified.   Consult Dr Jackelyn Knife EKG, limited OB u/s, Tylenol 1000 mg PO x 1 dose  Report to Thressa Sheller, CNM 2036: Consult with cardiology Dr. Leeann Must, will order troponin. EKG shows some abnormalities, however, it is similar to the EKG she had done in March.  2131: D/W the patient further. She states that she started having chest pain off and on in March. She was told that it was most likely due to weight gain. She then found out she was pregnant, and had not had any more chest pain until recently. A couple of weeks ago she  started to have the chest pain off and on again. She states that she does not currently have any chest pain, and that the pain fully resolved after taking tylenol here. She states that when she has the pain it is worse when she coughs, but taking a deep breath or changing positions does not improve or make the pain worse.  2229: Consult with Dr. Tresa Endo, FU with labs, negative troponin, and further history given. Plan to continue to monitor if sx become severe or exertional consider outpatient cardiology FU.   Assessment/Plan: 1. Dizziness   2. Headache in pregnancy, second trimester    PTL precautions Fetal kick counts  Return to MAU as needed  Consider cardiology FU as needed   Follow-up Information   Follow up with MEISINGER,TODD D, MD. (As scheduled)    Specialty:  Obstetrics and Gynecology   Contact information:   [address] [place] [address] [phone]          Medication List    ASK your doctor about these medications       prenatal multivitamin Tabs tablet  Take 1 tablet by mouth daily at 12 noon.        Sharen Counter Certified Nurse-Midwife 05/27/2014 10:28 PM

## 2014-05-27 NOTE — MAU Note (Signed)
Pt states she was doing laundry & her legs gave out, fell to her side, did not lose consciousness.  Pt states she was SOB during this incident, not now.  HA since 0600, tylenol did not help.  Having mid-chest pain that also started today, non-radiating, worse with coughing.  Denies abd pain or bleeding.

## 2014-05-27 NOTE — MAU Note (Signed)
Pt talking & walking without distress.

## 2014-05-27 NOTE — MAU Note (Signed)
Pt c/o of chest pain this morning, her legs gave out this afternoon, and now she has a head ache. Denied LOF or VB, +FM.

## 2014-05-31 ENCOUNTER — Encounter: Payer: Self-pay | Admitting: Neurology

## 2014-05-31 ENCOUNTER — Ambulatory Visit (INDEPENDENT_AMBULATORY_CARE_PROVIDER_SITE_OTHER): Payer: Medicaid Other | Admitting: Neurology

## 2014-05-31 VITALS — BP 114/71 | HR 77 | Ht 67.0 in | Wt 183.0 lb

## 2014-05-31 DIAGNOSIS — R569 Unspecified convulsions: Secondary | ICD-10-CM

## 2014-05-31 DIAGNOSIS — R29898 Other symptoms and signs involving the musculoskeletal system: Secondary | ICD-10-CM

## 2014-05-31 NOTE — Progress Notes (Signed)
PATIENT: Jasmin Wilson DOB: 07/11/1991  HISTORICAL ( initial visit Sep 4th 2015)  Jasmin Wilson this is 23 years old right-handed African American female, referred by her obstetrician Dr. Betsy Pries for evaluation of episodes of bilateral lower extremity gave out underneath her.  She is currently 6 months pregnant, only find out that she was pregnant in June 2015 after she presented to the emergency room, with episode of lower extremity pain,  Since June 2015, she had few episodes of bilateral lower extremity gave out underneath her, the most recent episode after she did some laundry, climbing up a flare of stairs, she felt lightheaded dizziness, neck gave out underneath her.  REVIEW OF SYSTEMS: Full 14 system review of systems performed and notable only for headache, dizziness, joint pain ALLERGIES: No Known Allergies  HOME MEDICATIONS: Current Outpatient Prescriptions on File Prior to Visit  Medication Sig Dispense Refill  . Prenatal Vit-Fe Fumarate-FA (PRENATAL MULTIVITAMIN) TABS tablet Take 1 tablet by mouth daily at 12 noon.         PAST MEDICAL HISTORY: Past Medical History  Diagnosis Date  . Seizures     as a child    PAST SURGICAL HISTORY: Past Surgical History  Procedure Laterality Date  . No past surgeries      FAMILY HISTORY: Family History  Problem Relation Age of Onset  . Diabetes Maternal Grandmother   . Hypertension Maternal Grandmother     SOCIAL HISTORY:  History   Social History  . Marital Status: Single    Spouse Name: N/A    Number of Children: N/A  . Years of Education: N/A   Occupational History  . Not on file.   Social History Main Topics  . Smoking status: Never Smoker   . Smokeless tobacco: Not on file  . Alcohol Use: No  . Drug Use: No  . Sexual Activity: Not on file   Other Topics Concern  . Not on file   Social History Narrative  . No narrative on file     PHYSICAL EXAM   There were no vitals filed for this  visit.  Not recorded    There is no weight on file to calculate BMI.   Generalized: In no acute distress  Neck: Supple, no carotid bruits   Cardiac: Regular rate rhythm  Pulmonary: Clear to auscultation bilaterally  Musculoskeletal: No deformity  Neurological examination  Mentation: Alert oriented to time, place, history taking, and causual conversation  Cranial nerve II-XII: Pupils were equal round reactive to light. Extraocular movements were full.  Visual field were full on confrontational test. Bilateral fundi were sharp.  Facial sensation and strength were normal. Hearing was intact to finger rubbing bilaterally. Uvula tongue midline.  Head turning and shoulder shrug and were normal and symmetric.Tongue protrusion into cheek strength was normal.  Motor: Normal tone, bulk and strength.  Sensory: Intact to fine touch, pinprick, preserved vibratory sensation, and proprioception at toes.  Coordination: Normal finger to nose, heel-to-shin bilaterally there was no truncal ataxia  Gait: Rising up from seated position without assistance, normal stance, without trunk ataxia, moderate stride, good arm swing, smooth turning, able to perform tiptoe, and heel walking without difficulty.   Romberg signs: Negative  Deep tendon reflexes: Brachioradialis 2/2, biceps 2/2, triceps 2/2, patellar 2/2, Achilles 2/2, plantar responses were flexor bilaterally.   DIAGNOSTIC DATA (LABS, IMAGING, TESTING) - I reviewed patient records, labs, notes, testing and imaging myself where available.  Lab Results  Component Value Date  WBC 6.3 01/22/2014   HGB 11.5* 01/22/2014   HCT 36.2 01/22/2014   MCV 78.7 01/22/2014   PLT 265 01/22/2014      Component Value Date/Time   NA 137 01/22/2014 1225   K 3.7 01/22/2014 1225   CL 103 01/22/2014 1225   CO2 22 01/22/2014 1225   GLUCOSE 86 01/22/2014 1225   BUN 7 01/22/2014 1225   CREATININE 0.72 01/22/2014 1225   CALCIUM 9.5 01/22/2014 1225   PROT 7.3 01/22/2014  1225   ALBUMIN 3.6 01/22/2014 1225   AST 15 01/22/2014 1225   ALT 7 01/22/2014 1225   ALKPHOS 69 01/22/2014 1225   BILITOT 0.3 01/22/2014 1225   GFRNONAA >90 01/22/2014 1225   GFRAA >90 01/22/2014 1225    ASSESSMENT AND PLAN  Jasmin Wilson is a 23 y.o. female currently 6 months pregnant, presenting with few episode of legs gave out underneath her, most suggestive of presyncope. Essentially normal neurological examination, there was no signs of distal leg muscle weakness, no sensory loss.  I have advised to keep well hydration, only return to clinic for new issues.   Levert Feinstein, M.D. Ph.D.  [facility] [address] [place], [place] [address] [phone]

## 2014-07-20 ENCOUNTER — Encounter (HOSPITAL_COMMUNITY): Payer: Self-pay

## 2014-07-20 ENCOUNTER — Inpatient Hospital Stay (HOSPITAL_COMMUNITY)
Admission: AD | Admit: 2014-07-20 | Discharge: 2014-07-20 | Disposition: A | Discharge status: Home / Self Care | Payer: Medicaid Other | Source: Ambulatory Visit | Attending: Obstetrics and Gynecology | Admitting: Obstetrics and Gynecology

## 2014-07-20 DIAGNOSIS — Z3A34 34 weeks gestation of pregnancy: Secondary | ICD-10-CM | POA: Insufficient documentation

## 2014-07-20 DIAGNOSIS — R109 Unspecified abdominal pain: Secondary | ICD-10-CM | POA: Insufficient documentation

## 2014-07-20 DIAGNOSIS — O9989 Other specified diseases and conditions complicating pregnancy, childbirth and the puerperium: Secondary | ICD-10-CM | POA: Diagnosis not present

## 2014-07-20 DIAGNOSIS — O3421 Maternal care for scar from previous cesarean delivery: Secondary | ICD-10-CM | POA: Diagnosis not present

## 2014-07-20 DIAGNOSIS — O26899 Other specified pregnancy related conditions, unspecified trimester: Secondary | ICD-10-CM

## 2014-07-20 LAB — URINALYSIS, ROUTINE W REFLEX MICROSCOPIC
Bilirubin Urine: NEGATIVE
Glucose, UA: NEGATIVE mg/dL
Hgb urine dipstick: NEGATIVE
Ketones, ur: 15 mg/dL — AB
Nitrite: NEGATIVE
PROTEIN: NEGATIVE mg/dL
Specific Gravity, Urine: 1.02 (ref 1.005–1.030)
UROBILINOGEN UA: 0.2 mg/dL (ref 0.0–1.0)
pH: 7 (ref 5.0–8.0)

## 2014-07-20 LAB — URINE MICROSCOPIC-ADD ON

## 2014-07-20 MED ORDER — CYCLOBENZAPRINE HCL 10 MG PO TABS
10.0000 mg | ORAL_TABLET | Freq: Once | ORAL | Status: AC
  Administered 2014-07-20: 10 mg via ORAL
  Filled 2014-07-20: qty 1

## 2014-07-20 MED ORDER — CYCLOBENZAPRINE HCL 10 MG PO TABS: 10.0000 mg | ORAL_TABLET | Freq: Two times a day (BID) | ORAL | Status: DC | PRN

## 2014-07-20 NOTE — Discharge Instructions (Signed)
Abdominal Pain During Pregnancy °Belly (abdominal) pain is common during pregnancy. Most of the time, it is not a serious problem. Other times, it can be a sign that something is wrong with the pregnancy. Always tell your doctor if you have belly pain. °HOME CARE °Monitor your belly pain for any changes. The following actions may help you feel better: °· Do not have sex (intercourse) or put anything in your vagina until you feel better. °· Rest until your pain stops. °· Drink clear fluids if you feel sick to your stomach (nauseous). Do not eat solid food until you feel better. °· Only take medicine as told by your doctor. °· Keep all doctor visits as told. °GET HELP RIGHT AWAY IF:  °· You are bleeding, leaking fluid, or pieces of tissue come out of your vagina. °· You have more pain or cramping. °· You keep throwing up (vomiting). °· You have pain when you pee (urinate) or have blood in your pee. °· You have a fever. °· You do not feel your baby moving as much. °· You feel very weak or feel like passing out. °· You have trouble breathing, with or without belly pain. °· You have a very bad headache and belly pain. °· You have fluid leaking from your vagina and belly pain. °· You keep having watery poop (diarrhea). °· Your belly pain does not go away after resting, or the pain gets worse. °MAKE SURE YOU:  °· Understand these instructions. °· Will watch your condition. °· Will get help right away if you are not doing well or get worse. °Document Released: 09/01/2009 Document Revised: 05/16/2013 Document Reviewed: 04/12/2013 °Exit[employer]® Patient Information ©2015 [employer]. This information is not intended to replace advice given to you by your health care provider. Make sure you discuss any questions you have with your health care provider. ° °

## 2014-07-20 NOTE — MAU Provider Note (Signed)
History     CSN: [id number]  Arrival date and time: 07/20/14 [id number]   First Provider Initiated Contact with Patient 07/20/14 [id number]      Chief Complaint  Patient presents with  . Contractions   HPI [name] is a 23 y.o. G1P0, per prenatal records, although patient states that she has had 4 previous c/s of full-term babies. She presents at 5928w1d who presents to MAU today with complaint of contractions. She states LUQ pain with contractions rated at 7/10. She states that the pain is constant and the contractions are coming every 1 minute. She states that this has been worsening since yesterday. She denies vaginal bleeding or LOF. She denies complications with this pregnancy or previous pregnancies. She states that she does not have custody of her other children. She reports normal fetal movement.   Patient is in MAU with her same sex partner. Partner asked to leave the room for SVE. Asked patient again about medical history and today's condition. She did not give any additional information.   OB History   Grav Para Term Preterm Abortions TAB SAB Ect Mult Living   5 4 4       4       Past Medical History  Diagnosis Date  . Seizures     as a child  . Weakness of distal arms and legs     Past Surgical History  Procedure Laterality Date  . No past surgeries      Family History  Problem Relation Age of Onset  . Diabetes Maternal Grandmother   . Hypertension Maternal Grandmother     History  Substance Use Topics  . Smoking status: Never Smoker   . Smokeless tobacco: Never Used  . Alcohol Use: No    Allergies: No Known Allergies  Prescriptions prior to admission  Medication Sig Dispense Refill  . Prenatal Vit-Fe Fumarate-FA (PRENATAL MULTIVITAMIN) TABS tablet Take 1 tablet by mouth daily at 12 noon.        Review of Systems  Constitutional: Negative for fever and malaise/fatigue.  Gastrointestinal: Positive for abdominal pain. Negative for nausea, vomiting,  diarrhea and constipation.  Genitourinary: Negative for dysuria, urgency and frequency.       Neg - vaginal bleeding, discharge, LOF   Physical Exam   Blood pressure 131/73, pulse 89, temperature 98.3 F (36.8 C), temperature source Oral, resp. rate 18, height 5\' 7"  (1.702 m), weight 189 lb 9.6 oz (86.002 kg), last menstrual period 09/18/2013.  Physical Exam  Constitutional: She is oriented to person, place, and time. She appears well-developed and well-nourished. No distress.  HENT:  Head: Normocephalic.  Cardiovascular: Normal rate.   Respiratory: Effort normal.  GI: Soft. She exhibits no distension and no mass. There is tenderness (mild tenderness to palpation of the LUQ). There is no rebound and no guarding.  No evidence of surgical scarring  Neurological: She is alert and oriented to person, place, and time.  Skin: Skin is warm and dry. No erythema.  Psychiatric: She has a normal mood and affect.  Dilation: Fingertip Effacement (%): Thick Exam by:: Vonzella NippleJulie Wenzel [facility]  Results for orders placed during the hospital encounter of 07/20/14 (from the past 24 hour(s))  URINALYSIS, ROUTINE W REFLEX MICROSCOPIC     Status: Abnormal   Collection Time    07/20/14  4:00 AM      Result Value Ref Range   Color, Urine YELLOW  YELLOW   APPearance CLEAR  CLEAR   Specific Gravity,  Urine 1.020  1.005 - 1.030   pH 7.0  5.0 - 8.0   Glucose, UA NEGATIVE  NEGATIVE mg/dL   Hgb urine dipstick NEGATIVE  NEGATIVE   Bilirubin Urine NEGATIVE  NEGATIVE   Ketones, ur 15 (*) NEGATIVE mg/dL   Protein, ur NEGATIVE  NEGATIVE mg/dL   Urobilinogen, UA 0.2  0.0 - 1.0 mg/dL   Nitrite NEGATIVE  NEGATIVE   Leukocytes, UA MODERATE (*) NEGATIVE  URINE MICROSCOPIC-ADD ON     Status: None   Collection Time    07/20/14  4:00 AM      Result Value Ref Range   Squamous Epithelial / LPF RARE  RARE   WBC, UA 3-6  <3 WBC/hpf   RBC / HPF 0-2  <3 RBC/hpf   Bacteria, UA RARE  RARE   Fetal Monitoring: Baseline: 120  bpm, moderate variability, + accelerations, no decelerations Contractions: none  Patient Vitals for the past 24 hrs:  BP Temp Temp src Pulse Resp Height Weight  07/20/14 0550 131/73 mmHg - - 89 - - -  07/20/14 0428 136/79 mmHg - - 93 - - -  07/20/14 0415 144/82 mmHg 98.3 F (36.8 C) Oral 98 18 - -  07/20/14 0404 - - - - - 5\' 7"  (1.702 m) 189 lb 9.6 oz (86.002 kg)    MAU Course  Procedures None  MDM UA today Discussed patient with Dr. Ellyn HackBovard. Flexeril given in MAU. Monitor for contractions x 1 hour.  Patient states pain is better and she is ready to go home. Assessment and Plan  A: SIUP at 5278w1d Abdominal pain in pregnancy, third trimester  P: Discharge home Rx for Flexeril given to patient Patient [facility] or sooner if symptoms persist or worsen Patient may return to MAU as needed or if her condition were to change or worsen  Marny LowensteinJulie N Wenzel, PA-C  07/20/2014, 6:01 AM

## 2014-07-20 NOTE — MAU Note (Addendum)
Pt reports constant abdominal pain that feels like contractions; unable to determine when pain started. Denies vaginal bleeding or LOF. Positive fetal movement. States pregnancy normal thus far. States has previously had 4 term vaginal deliveries; discussed with pt that her prenatal record lists her as a G1, pt states this is her 5th child; verified pt's name on prenatal record.

## 2014-07-29 ENCOUNTER — Encounter (HOSPITAL_COMMUNITY): Payer: Self-pay

## 2014-07-29 IMAGING — CR DG CHEST 2V
2 series · 2 of 2 positions shown · non-contrast
Comparison: None.

CLINICAL DATA: Shortness of breath and chest pain

EXAM:
CHEST  2 VIEW

[w chest pa]
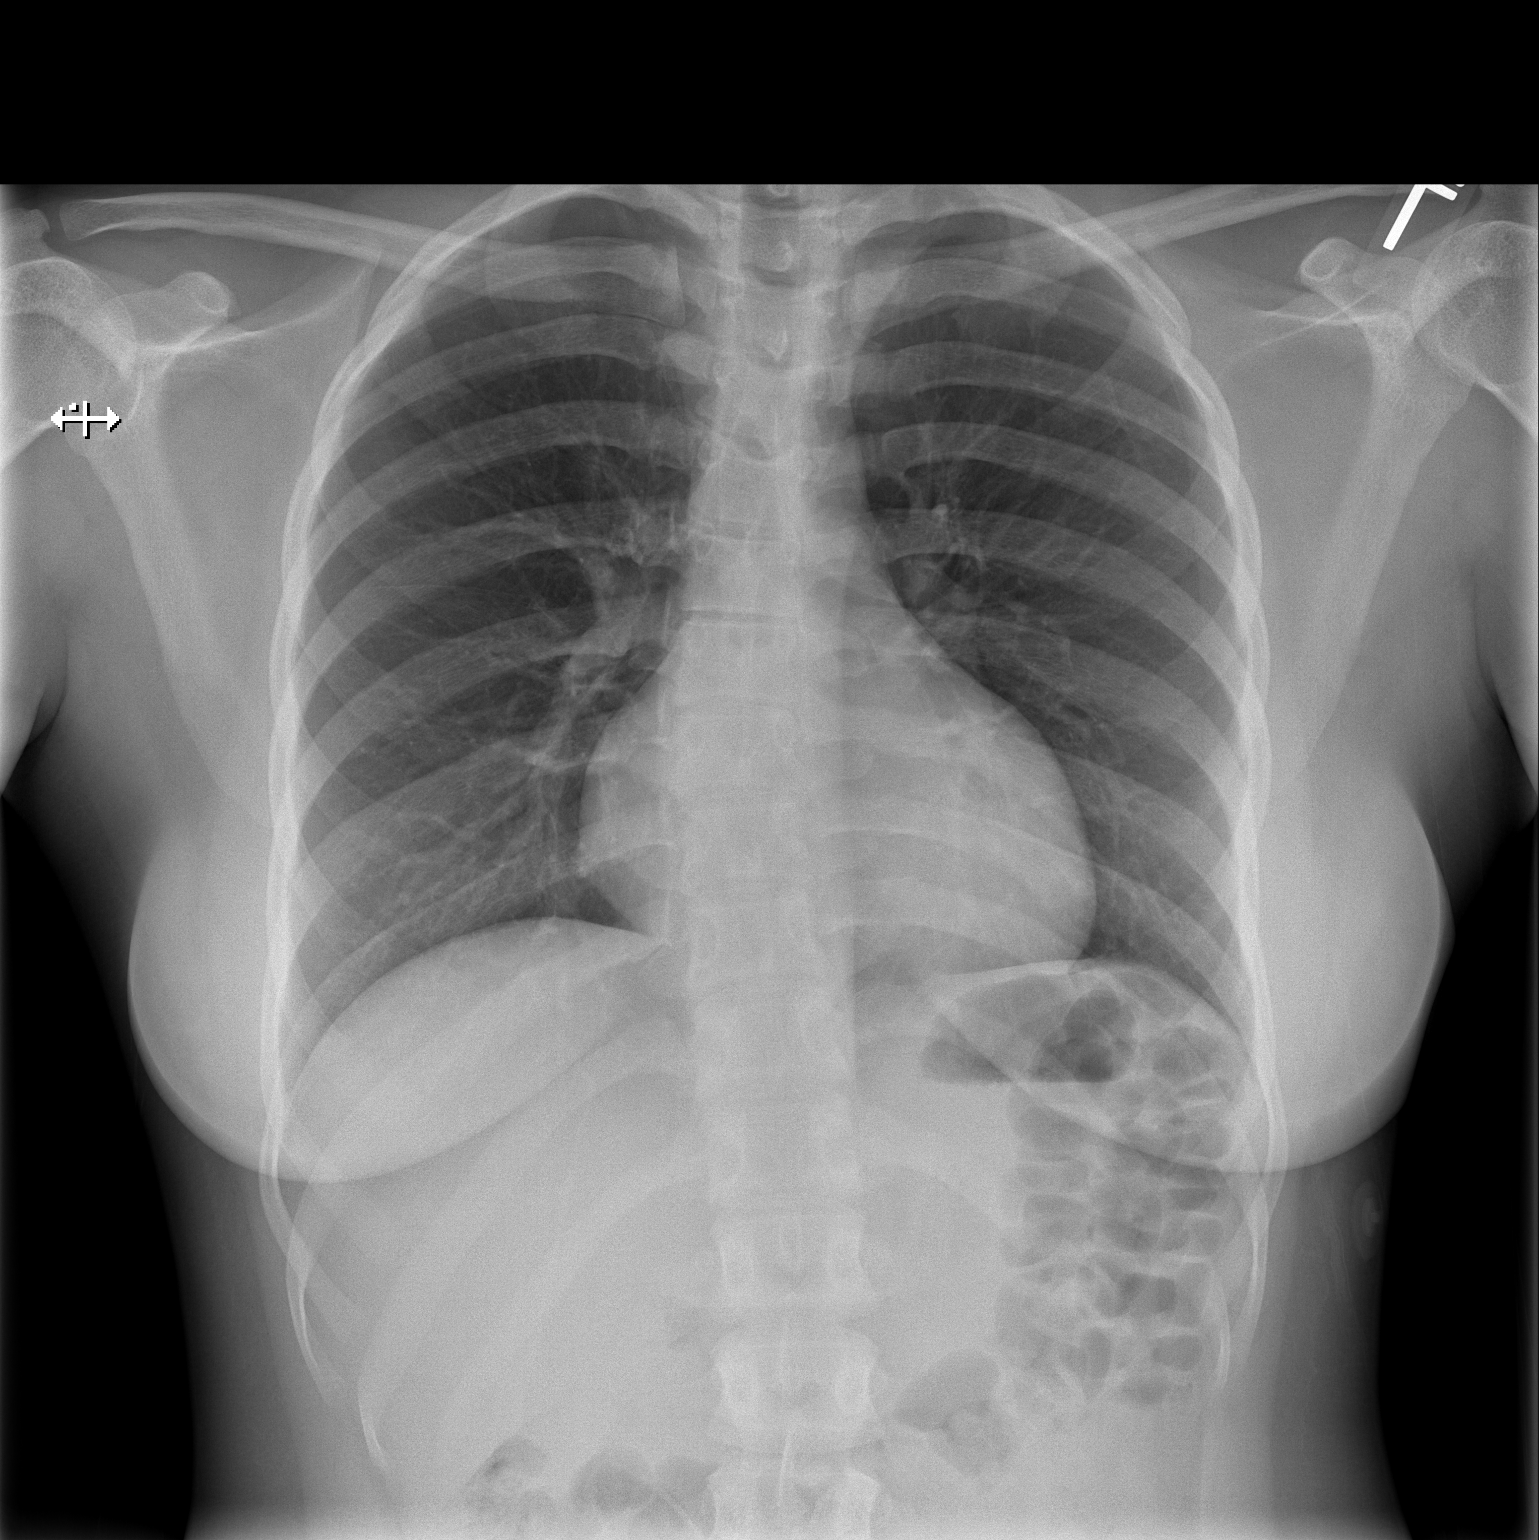

[w chest lat]
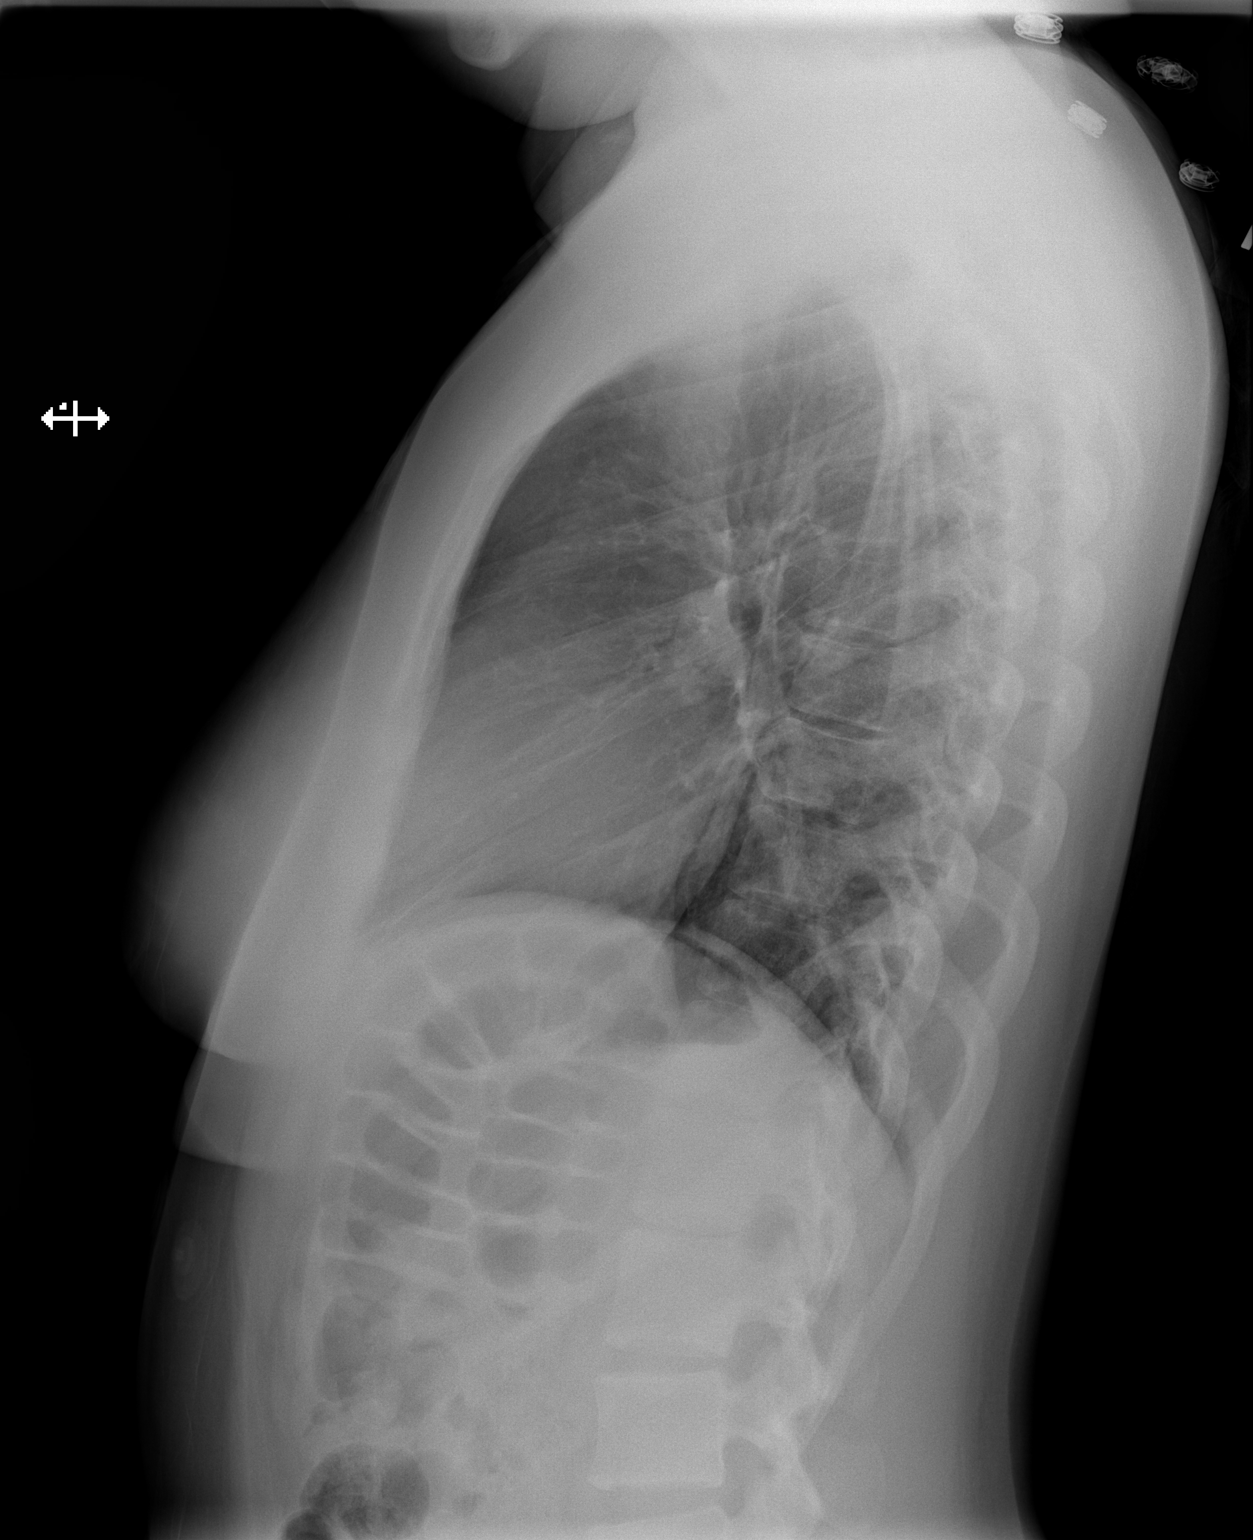

[2 of 2 positions shown; findings below may reference images not displayed]

FINDINGS: The lungs are clear. Heart size and pulmonary vascularity are
normal. No pneumothorax. No adenopathy. There is mild upper thoracic
levoscoliosis.
IMPRESSION: No abnormality noted.

## 2014-08-23 LAB — OB RESULTS CONSOLE GBS: GBS: POSITIVE

## 2014-08-27 ENCOUNTER — Inpatient Hospital Stay (HOSPITAL_COMMUNITY): Payer: Medicaid Other | Admitting: Anesthesiology

## 2014-08-27 ENCOUNTER — Encounter (HOSPITAL_COMMUNITY): Payer: Self-pay | Admitting: *Deleted

## 2014-08-27 ENCOUNTER — Inpatient Hospital Stay (HOSPITAL_COMMUNITY)
Admission: AD | Admit: 2014-08-27 | Discharge: 2014-08-29 | DRG: 775 | Disposition: A | Discharge status: Home / Self Care | Payer: Medicaid Other | Source: Ambulatory Visit | Attending: Obstetrics and Gynecology | Admitting: Obstetrics and Gynecology

## 2014-08-27 DIAGNOSIS — O99824 Streptococcus B carrier state complicating childbirth: Secondary | ICD-10-CM | POA: Diagnosis present

## 2014-08-27 DIAGNOSIS — Z3A39 39 weeks gestation of pregnancy: Secondary | ICD-10-CM | POA: Diagnosis present

## 2014-08-27 DIAGNOSIS — Z8249 Family history of ischemic heart disease and other diseases of the circulatory system: Secondary | ICD-10-CM | POA: Diagnosis not present

## 2014-08-27 DIAGNOSIS — Z833 Family history of diabetes mellitus: Secondary | ICD-10-CM | POA: Diagnosis not present

## 2014-08-27 DIAGNOSIS — IMO0001 Reserved for inherently not codable concepts without codable children: Secondary | ICD-10-CM

## 2014-08-27 LAB — CBC
HEMATOCRIT: 32.7 % — AB (ref 36.0–46.0)
HEMOGLOBIN: 10.4 g/dL — AB (ref 12.0–15.0)
MCH: 24.5 pg — ABNORMAL LOW (ref 26.0–34.0)
MCHC: 31.8 g/dL (ref 30.0–36.0)
MCV: 77.1 fL — ABNORMAL LOW (ref 78.0–100.0)
Platelets: 223 10*3/uL (ref 150–400)
RBC: 4.24 MIL/uL (ref 3.87–5.11)
RDW: 16.7 % — AB (ref 11.5–15.5)
WBC: 9.2 10*3/uL (ref 4.0–10.5)

## 2014-08-27 LAB — RPR

## 2014-08-27 LAB — ABO/RH: ABO/RH(D): B POS

## 2014-08-27 LAB — TYPE AND SCREEN
ABO/RH(D): B POS
Antibody Screen: NEGATIVE

## 2014-08-27 LAB — HIV ANTIBODY (ROUTINE TESTING W REFLEX): HIV: NONREACTIVE

## 2014-08-27 MED ORDER — FENTANYL 2.5 MCG/ML BUPIVACAINE 1/10 % EPIDURAL INFUSION (WH - ANES)
INTRAMUSCULAR | Status: DC | PRN
  Administered 2014-08-27: 14 mL/h via EPIDURAL

## 2014-08-27 MED ORDER — LACTATED RINGERS IV SOLN: 500.0000 mL | Freq: Once | INTRAVENOUS | Status: DC

## 2014-08-27 MED ORDER — LACTATED RINGERS IV SOLN
INTRAVENOUS | Status: DC
Start: 2014-08-27 — End: 2014-08-27
  Administered 2014-08-27: 12:00:00 via INTRAVENOUS

## 2014-08-27 MED ORDER — DIBUCAINE 1 % RE OINT: 1.0000 "application " | TOPICAL_OINTMENT | RECTAL | Status: DC | PRN

## 2014-08-27 MED ORDER — LIDOCAINE HCL (PF) 1 % IJ SOLN
INTRAMUSCULAR | Status: DC | PRN
  Administered 2014-08-27 (×2): 4 mL

## 2014-08-27 MED ORDER — OXYCODONE-ACETAMINOPHEN 5-325 MG PO TABS: 1.0000 | ORAL_TABLET | ORAL | Status: DC | PRN

## 2014-08-27 MED ORDER — BUTORPHANOL TARTRATE 1 MG/ML IJ SOLN: 1.0000 mg | INTRAMUSCULAR | Status: DC | PRN

## 2014-08-27 MED ORDER — EPHEDRINE 5 MG/ML INJ
10.0000 mg | INTRAVENOUS | Status: DC | PRN
  Filled 2014-08-27: qty 2

## 2014-08-27 MED ORDER — ACETAMINOPHEN 325 MG PO TABS: 650.0000 mg | ORAL_TABLET | ORAL | Status: DC | PRN

## 2014-08-27 MED ORDER — PHENYLEPHRINE 40 MCG/ML (10ML) SYRINGE FOR IV PUSH (FOR BLOOD PRESSURE SUPPORT)
80.0000 ug | PREFILLED_SYRINGE | INTRAVENOUS | Status: DC | PRN
  Filled 2014-08-27: qty 2

## 2014-08-27 MED ORDER — LIDOCAINE HCL (PF) 1 % IJ SOLN
30.0000 mL | INTRAMUSCULAR | Status: DC | PRN
  Filled 2014-08-27: qty 30

## 2014-08-27 MED ORDER — OXYTOCIN 40 UNITS IN LACTATED RINGERS INFUSION - SIMPLE MED
62.5000 mL/h | INTRAVENOUS | Status: DC
  Administered 2014-08-27: 62.5 mL/h via INTRAVENOUS

## 2014-08-27 MED ORDER — PENICILLIN G POTASSIUM 5000000 UNITS IJ SOLR
5.0000 10*6.[IU] | Freq: Once | INTRAMUSCULAR | Status: AC
  Administered 2014-08-27: 5 10*6.[IU] via INTRAVENOUS
  Filled 2014-08-27: qty 5

## 2014-08-27 MED ORDER — TETANUS-DIPHTH-ACELL PERTUSSIS 5-2.5-18.5 LF-MCG/0.5 IM SUSP: 0.5000 mL | Freq: Once | INTRAMUSCULAR | Status: DC

## 2014-08-27 MED ORDER — DIPHENHYDRAMINE HCL 25 MG PO CAPS: 25.0000 mg | ORAL_CAPSULE | Freq: Four times a day (QID) | ORAL | Status: DC | PRN

## 2014-08-27 MED ORDER — ZOLPIDEM TARTRATE 5 MG PO TABS: 5.0000 mg | ORAL_TABLET | Freq: Every evening | ORAL | Status: DC | PRN

## 2014-08-27 MED ORDER — LACTATED RINGERS IV SOLN
500.0000 mL | INTRAVENOUS | Status: DC | PRN
  Administered 2014-08-27: 500 mL via INTRAVENOUS

## 2014-08-27 MED ORDER — BENZOCAINE-MENTHOL 20-0.5 % EX AERO: 1.0000 "application " | INHALATION_SPRAY | CUTANEOUS | Status: DC | PRN

## 2014-08-27 MED ORDER — CITRIC ACID-SODIUM CITRATE 334-500 MG/5ML PO SOLN: 30.0000 mL | ORAL | Status: DC | PRN

## 2014-08-27 MED ORDER — IBUPROFEN 600 MG PO TABS
600.0000 mg | ORAL_TABLET | Freq: Four times a day (QID) | ORAL | Status: DC
  Administered 2014-08-28 – 2014-08-29 (×6): 600 mg via ORAL
  Filled 2014-08-27 (×6): qty 1

## 2014-08-27 MED ORDER — DIPHENHYDRAMINE HCL 50 MG/ML IJ SOLN: 12.5000 mg | INTRAMUSCULAR | Status: DC | PRN

## 2014-08-27 MED ORDER — WITCH HAZEL-GLYCERIN EX PADS: 1.0000 "application " | MEDICATED_PAD | CUTANEOUS | Status: DC | PRN

## 2014-08-27 MED ORDER — LANOLIN HYDROUS EX OINT: TOPICAL_OINTMENT | CUTANEOUS | Status: DC | PRN

## 2014-08-27 MED ORDER — OXYCODONE-ACETAMINOPHEN 5-325 MG PO TABS: 2.0000 | ORAL_TABLET | ORAL | Status: DC | PRN

## 2014-08-27 MED ORDER — SIMETHICONE 80 MG PO CHEW: 80.0000 mg | CHEWABLE_TABLET | ORAL | Status: DC | PRN

## 2014-08-27 MED ORDER — TERBUTALINE SULFATE 1 MG/ML IJ SOLN: 0.2500 mg | Freq: Once | INTRAMUSCULAR | Status: DC | PRN

## 2014-08-27 MED ORDER — ONDANSETRON HCL 4 MG/2ML IJ SOLN: 4.0000 mg | Freq: Four times a day (QID) | INTRAMUSCULAR | Status: DC | PRN

## 2014-08-27 MED ORDER — OXYTOCIN 40 UNITS IN LACTATED RINGERS INFUSION - SIMPLE MED
1.0000 m[IU]/min | INTRAVENOUS | Status: DC
  Administered 2014-08-27: 2 m[IU]/min via INTRAVENOUS
  Filled 2014-08-27: qty 1000

## 2014-08-27 MED ORDER — SENNOSIDES-DOCUSATE SODIUM 8.6-50 MG PO TABS
2.0000 | ORAL_TABLET | ORAL | Status: DC
  Administered 2014-08-28 – 2014-08-29 (×2): 2 via ORAL
  Filled 2014-08-27 (×2): qty 2

## 2014-08-27 MED ORDER — OXYCODONE-ACETAMINOPHEN 5-325 MG PO TABS
1.0000 | ORAL_TABLET | ORAL | Status: DC | PRN
  Administered 2014-08-28 – 2014-08-29 (×2): 1 via ORAL
  Filled 2014-08-27 (×3): qty 1

## 2014-08-27 MED ORDER — PHENYLEPHRINE 40 MCG/ML (10ML) SYRINGE FOR IV PUSH (FOR BLOOD PRESSURE SUPPORT)
80.0000 ug | PREFILLED_SYRINGE | INTRAVENOUS | Status: DC | PRN
  Filled 2014-08-27: qty 2
  Filled 2014-08-27: qty 10

## 2014-08-27 MED ORDER — ONDANSETRON HCL 4 MG PO TABS: 4.0000 mg | ORAL_TABLET | ORAL | Status: DC | PRN

## 2014-08-27 MED ORDER — ONDANSETRON HCL 4 MG/2ML IJ SOLN: 4.0000 mg | INTRAMUSCULAR | Status: DC | PRN

## 2014-08-27 MED ORDER — FENTANYL 2.5 MCG/ML BUPIVACAINE 1/10 % EPIDURAL INFUSION (WH - ANES)
14.0000 mL/h | INTRAMUSCULAR | Status: DC | PRN
  Filled 2014-08-27: qty 125

## 2014-08-27 MED ORDER — EPHEDRINE 5 MG/ML INJ
10.0000 mg | INTRAVENOUS | Status: DC | PRN
Start: 2014-08-27 — End: 2014-08-27
  Filled 2014-08-27: qty 2

## 2014-08-27 MED ORDER — PRENATAL MULTIVITAMIN CH
1.0000 | ORAL_TABLET | Freq: Every day | ORAL | Status: DC
  Administered 2014-08-28: 1 via ORAL
  Filled 2014-08-27: qty 1

## 2014-08-27 MED ORDER — PENICILLIN G POTASSIUM 5000000 UNITS IJ SOLR
2.5000 10*6.[IU] | INTRAMUSCULAR | Status: DC
Start: 2014-08-27 — End: 2014-08-27
  Administered 2014-08-27: 2.5 10*6.[IU] via INTRAVENOUS
  Filled 2014-08-27 (×4): qty 2.5

## 2014-08-27 MED ORDER — OXYTOCIN BOLUS FROM INFUSION: 500.0000 mL | INTRAVENOUS | Status: DC

## 2014-08-27 NOTE — Anesthesia Preprocedure Evaluation (Addendum)
Anesthesia Evaluation  Patient identified by MRN, date of birth, ID band Patient awake    Reviewed: Allergy & Precautions, H&P , NPO status , Patient's Chart, lab work & pertinent test results  Airway Mallampati: III  TM Distance: >3 FB Neck ROM: Full    Dental no notable dental hx. (+) Teeth Intact   Pulmonary neg pulmonary ROS,  breath sounds clear to auscultation  Pulmonary exam normal       Cardiovascular negative cardio ROS  Rhythm:Regular Rate:Normal     Neuro/Psych Seizures -, Well Controlled,     GI/Hepatic negative GI ROS, Neg liver ROS,   Endo/Other  Obesity  Renal/GU negative Renal ROS  negative genitourinary   Musculoskeletal negative musculoskeletal ROS (+)   Abdominal (+) + obese,   Peds  Hematology  (+) anemia ,   Anesthesia Other Findings   Reproductive/Obstetrics                            Anesthesia Physical Anesthesia Plan  ASA: II  Anesthesia Plan: Epidural   Post-op Pain Management:    Induction:   Airway Management Planned: Natural Airway  Additional Equipment:   Intra-op Plan:   Post-operative Plan:   Informed Consent: I have reviewed the patients History and Physical, chart, labs and discussed the procedure including the risks, benefits and alternatives for the proposed anesthesia with the patient or authorized representative who has indicated his/her understanding and acceptance.     Plan Discussed with: Anesthesiologist  Anesthesia Plan Comments:        Anesthesia Quick Evaluation

## 2014-08-27 NOTE — Progress Notes (Signed)
Patient ID: Jasmin Wilson, female   DOB: 24-May-1991, 24 y.o.   MRN: [id number] Pt comfortable with epidural, resting afeb VSS FHR reassuring Cervix 80/5/-1 AROM clear IUPC placed and will augment with pitocin as contractions have spaced out.

## 2014-08-27 NOTE — MAU Note (Signed)
Contractions started last night, went away and started again this morning.

## 2014-08-27 NOTE — Anesthesia Procedure Notes (Signed)
Epidural Patient location during procedure: OB Start time: 08/27/2014 1:36 PM  Staffing Anesthesiologist: Zayvian Mcmurtry A. Performed by: anesthesiologist   Preanesthetic Checklist Completed: patient identified, site marked, surgical consent, pre-op evaluation, timeout performed, IV checked, risks and benefits discussed and monitors and equipment checked  Epidural Patient position: sitting Prep: site prepped and draped and DuraPrep Patient monitoring: continuous pulse ox and blood pressure Approach: midline Location: L3-L4 Injection technique: LOR air  Needle:  Needle type: Tuohy  Needle gauge: 17 G Needle length: 9 cm and 9 Needle insertion depth: 5 cm cm Catheter type: closed end flexible Catheter size: 19 Gauge Catheter at skin depth: 10 cm Test dose: negative and Other  Assessment Events: blood not aspirated, injection not painful, no injection resistance, negative IV test and no paresthesia  Additional Notes Patient identified. Risks and benefits discussed including failed block, incomplete  Pain control, post dural puncture headache, nerve damage, paralysis, blood pressure Changes, nausea, vomiting, reactions to medications-both toxic and allergic and post Partum back pain. All questions were answered. Patient expressed understanding and wished to proceed. Sterile technique was used throughout procedure. Epidural site was Dressed with sterile barrier dressing. No paresthesias, signs of intravascular injection Or signs of intrathecal spread were encountered.  Patient was more comfortable after the epidural was dosed. Please see RN's note for documentation of vital signs and FHR which are stable.

## 2014-08-27 NOTE — Plan of Care (Signed)
Problem: Consults Goal: Postpartum Patient Education (See Patient Education module for education specifics.)  Outcome: Progressing     

## 2014-08-27 NOTE — Progress Notes (Signed)
Patient ID: Jasmin Wilson, female   DOB: 11/27/1990, 23 y.o.   MRN: [id number] Pt comfortable with epidural FHR overall reassuring with some early decels with contractions and mild Cervix c/8/0  Progressing well. Continue to follow progress.

## 2014-08-27 NOTE — H&P (Signed)
Jasmin Wilson is a 23 y.o. female G1P0 at 39+ weeks (EDD 08/30/14 by 8 week US) presenting for contractions and cervical change from 2 to 4 cm in MAU.  Prenatal care complicated by +GBS status.  Otherwise some social issues with unplanned pregnancy outside of her steady relationship with a female partner.    Maternal Medical History:  Reason for admission: Contractions.   Contractions: Onset was 3-5 hours ago.   Frequency: regular.   Perceived severity is moderate.    Fetal activity: Perceived fetal activity is normal.    Prenatal Complications - Diabetes: none.    OB History    Gravida Para Term Preterm AB TAB SAB Ectopic Multiple Living   1 0 0       0     Past Medical History  Diagnosis Date  . Seizures     as a child  . Weakness of distal arms and legs    Past Surgical History  Procedure Laterality Date  . No past surgeries     Family History: family history includes Diabetes in her maternal grandmother; Hypertension in her maternal grandmother. Social History:  reports that she has never smoked. She has never used smokeless tobacco. She reports that she does not drink alcohol or use illicit drugs.   Prenatal Transfer Tool  Maternal Diabetes: No Genetic Screening: Normal Maternal Ultrasounds/Referrals: Normal Fetal Ultrasounds or other Referrals:  None Maternal Substance Abuse:  No Significant Maternal Medications:  None Significant Maternal Lab Results:  Lab values include: Group B Strep positive Other Comments:  None  ROS  Dilation: 4 Effacement (%): 90 Station: -2 Exam by:: S. Carrera, RNC Blood pressure 124/70, pulse 77, temperature 98.1 F (36.7 C), temperature source Oral, resp. rate 18, height 5\' 7"  (1.702 m), weight 90.901 kg (200 lb 6.4 oz), last menstrual period 09/18/2013. Maternal Exam:  Uterine Assessment: Contraction strength is moderate.  Contraction frequency is regular.   Abdomen: Patient reports no abdominal tenderness. Fetal presentation:  vertex  Introitus: Normal vulva. Normal vagina.    Physical Exam  Constitutional: She is oriented to person, place, and time. She appears well-developed and well-nourished.  Cardiovascular: Normal rate and regular rhythm.   Respiratory: Effort normal.  GI: Soft.  Genitourinary: Vagina normal.  Neurological: She is alert and oriented to person, place, and time.  Psychiatric: She has a normal mood and affect. Her behavior is normal.    Prenatal labs: ABO, Rh:  B positive Antibody:  Negative Rubella:  Immune RPR:   NR HBsAg:   Neg HIV:   NR GBS: Positive (11/27 0000)  First trimester screen and AFP negative Hgb AA One hour GTT 72  Assessment/Plan: Pt admitted in labor with cervical change.  +GBS so will place on PCN prophylaxis, AROM after that is on board.  Epidural prn.   Oliver PilaRICHARDSON,Lula Kolton W 08/27/2014, 12:33 PM

## 2014-08-28 ENCOUNTER — Encounter (HOSPITAL_COMMUNITY): Payer: Self-pay

## 2014-08-28 LAB — CBC
HCT: 32.9 % — ABNORMAL LOW (ref 36.0–46.0)
HEMOGLOBIN: 10.1 g/dL — AB (ref 12.0–15.0)
MCH: 23.9 pg — AB (ref 26.0–34.0)
MCHC: 30.7 g/dL (ref 30.0–36.0)
MCV: 77.8 fL — ABNORMAL LOW (ref 78.0–100.0)
PLATELETS: 201 10*3/uL (ref 150–400)
RBC: 4.23 MIL/uL (ref 3.87–5.11)
RDW: 17 % — ABNORMAL HIGH (ref 11.5–15.5)
WBC: 13.9 10*3/uL — ABNORMAL HIGH (ref 4.0–10.5)

## 2014-08-28 NOTE — Progress Notes (Signed)
Clinical Social Work Department PSYCHOSOCIAL ASSESSMENT - MATERNAL/CHILD 08/28/2014  Patient:  Jasmin Wilson,Jasmin Wilson  Account Number:  [id number]  Admit Date:  08/27/2014  Childs Name:   Jasmin Wilson   Clinical Social Worker:  SARAH VENNING, CLINICAL SOCIAL WORKER   Date/Time:  08/28/2014 10:30 AM  Date Referred:  08/27/2014   Referral source  Central Nursery     Referred reason  Psychosocial assessment   Other referral source:    I:  FAMILY / HOME ENVIRONMENT Child's legal guardian:  PARENT  Guardian - Name Guardian - Age Guardian - Address  Merilynn Hartje [address] [place], [place] [address]  Johnny  other residence, minimally involved   Other household support members/support persons Name Relationship DOB   SISTER    NEPHEW    NIECE    Other support:   MOB identified her sister,cousin, and girlfriend as her primary supports.    II  PSYCHOSOCIAL DATA Information Source:  Patient Interview  Financial and Community Resources Employment:   MOB stated that she is unemployed and receives disability due to her history of epilepsy.   Financial resources:  Medicaid If Medicaid - County:  [place] Other  Food Stamps  WIC   School / Grade:  N/A Maternity Care Coordinator / Child Services Coordination / Early Interventions:   None reported  Cultural issues impacting care:   None reported    III  STRENGTHS Strengths  Adequate Resources  Home prepared for Child (including basic supplies)  Supportive family/friends   Strength comment:  MOB discussed efforts to prepare the home for the baby by utilizing her supports.   IV  RISK FACTORS AND CURRENT PROBLEMS Current Problem:  YES   Risk Factor & Current Problem Patient Issue Family Issue Risk Factor / Current Problem Comment  Housing Concerns Y N MOB is currently on wait list for Section 8 housing.  She reported history of applying for [facility] [employer] and [employer].  Family/Relationship Issues Y N MOB reported strained  relationship with the FOB, but shared that she does not believe that his non-involvement is a stressor.         V  SOCIAL WORK ASSESSMENT CSW met with the MOB in order to complete psychosocial assessment.  CSW received consult due to MOB reporting an unplanned pregnancy and having limited involvement with the FOB.  MOB was receptive to the visit, was easily engaged, and expressed appreciation for CSW visit.  She displayed an appropriate range in affect and presented in a pleasant mood.  CSW noted that the room was dark and the MOB presented with limited engagement with the baby.   CSW provided supportive listening and assisted the MOB process her thoughts and feeling secondary to psychosocial stressors.  The MOB shared that she is excited that the baby has been born, and discussed belief that she is prepared to parent since she has assisted her sister with her children.  She discussed that she currently lives with her sister and her two children (ages 8 and 2 weeks old).  MOB discussed her efforts to secure her own housing since her sister receives Section 8 housing and she is not on the lease.  She stated that she has applied for Section 8 and [employer]. MOB expressed interest in applying for [facility] housing, but verbalized understanding that all housing resources have extensive waiting lists.  She continued to discuss the limited involvement of the FOB.  She stated that she has limited contact with him since he contributes   to increased stress in her life. Per the MOB, he is questioning if he is the father, and she shared that it is better if he is not involved.  She did not identify the FOB's lack of involvement as a problem since she believes that she is supported by her girlfriend "Jessica", who lives in [place], [place].  MOB shared that there was some relational stress when she disclosed that she was pregnant since she was concerned that the MOB had been unfaithful, but shared that it transitioned  to the girlfriend being excited and supportive.  The MOB also shared that she has support from her cousin and her cousin's friend. Per MOB, all of her support system has assisted her to secure all necessary items for the baby.  She shared that she has a car seat, a crib, clothing, and diapers.    CSW inquired about MOB ability to cope with the multiple stressors that she discussed.  She smiled, and stated that she does not allow stress to bother her.  MOB shared that she remains focused on her goals, and identified her goals as securing her own housing and being a good mother.  She was able to identify evidence that supports that she is working toward her goals.  MOB was observed to be smiling as she reflected upon her progress toward goal obtainment.    MOB denied mental health history, but was receptive to education on postpartum depression.      VI SOCIAL WORK PLAN Social Work Plan  Information/Referral to Community Resources  Patient/Family Education  Ongoing psychosocial support   Type of pt/family education:   Postpartum depression   If child protective services report - county:   If child protective services report - date:   Information/referral to community resources comment:   CSW provided MOB with an application for [employer], per MOB request.  MOB expressed appreciation.  CSW agreed to return on 12/3 in order to collect application in order to fax it on behalf of MOB.     Other social work plan:   CSW will continue to closely follow.  CSW will follow-up with the MOB on 12/3 to follow-up with housing application and will explore nursing and CSW observations of limited bonding with the baby.  CSW will also assess for additional in-home parenting resources.      

## 2014-08-28 NOTE — Plan of Care (Signed)
Problem: Consults Goal: Postpartum Patient Education (See Patient Education module for education specifics.)  Outcome: Completed/Met Date Met:  08/28/14  Problem: Phase I Progression Outcomes Goal: Pain controlled with appropriate interventions Outcome: Progressing Goal: OOB as tolerated unless otherwise ordered Outcome: Progressing Goal: VS, stable, temp < 100.4 degrees F Outcome: Progressing Goal: Initial discharge plan identified Outcome: Completed/Met Date Met:  08/28/14 Goal: Other Phase I Outcomes/Goals Outcome: Not Applicable Date Met:  93/73/74  Problem: Phase II Progression Outcomes Goal: Pain controlled on oral analgesia Outcome: Completed/Met Date Met:  08/28/14 Goal: Progress activity as tolerated unless otherwise ordered Outcome: Completed/Met Date Met:  08/28/14 Goal: Afebrile, VS remain stable Outcome: Completed/Met Date Met:  08/28/14 Goal: Incision intact & without signs/symptoms of infection Outcome: Not Applicable Date Met:  96/64/66 Goal: Tolerating diet Outcome: Completed/Met Date Met:  08/28/14 Goal: Other Phase II Outcomes/Goals Outcome: Not Applicable Date Met:  05/63/72

## 2014-08-28 NOTE — Progress Notes (Signed)
UR chart review completed.  

## 2014-08-28 NOTE — Progress Notes (Signed)
Post Partum Day 1 Subjective: Pt unable to void yet since delivery, catherterized x 2, if cannot void soon, will place foley x 24 hours Pain fine and minimal VB  Objective: Blood pressure 125/61, pulse 71, temperature 98 F (36.7 C), temperature source Oral, resp. rate 18, height 5\' 7"  (1.702 m), weight 90.901 kg (200 lb 6.4 oz), last menstrual period 09/18/2013, SpO2 100 %, unknown if currently breastfeeding.  Physical Exam:  General: alert and cooperative Lochia: appropriate Uterine Fundus: firm    Recent Labs  08/27/14 1212 08/28/14 0555  HGB 10.4* 10.1*  HCT 32.7* 32.9*    Assessment/Plan: Plan for discharge tomorrow  Foley catheter if unable to void in next hour   LOS: 1 day   Lilburn Straw W 08/28/2014, 8:51 AM

## 2014-08-28 NOTE — Anesthesia Postprocedure Evaluation (Signed)
  Anesthesia Post-op Note  Patient: Jasmin RummageDeborha T Wilson  Procedure(s) Performed: * No procedures listed *  Patient Location: Mother/Baby  Anesthesia Type:Epidural  Level of Consciousness: awake, alert , oriented and patient cooperative  Airway and Oxygen Therapy: Patient Spontanous Breathing  Post-op Pain: none  Post-op Assessment: Post-op Vital signs reviewed, Patient's Cardiovascular Status Stable, Respiratory Function Stable, Patent Airway, No signs of Nausea or vomiting, Adequate PO intake, Pain level controlled, Pain level not controlled and No headache  Post-op Vital Signs: Reviewed and stable  Last Vitals:  Filed Vitals:   08/28/14 0245  BP: 125/61  Pulse: 71  Temp: 36.7 C  Resp: 18    Complications: No apparent anesthesia complications

## 2014-08-29 MED ORDER — IBUPROFEN 600 MG PO TABS: 600.0000 mg | ORAL_TABLET | Freq: Four times a day (QID) | ORAL | Status: DC

## 2014-08-29 MED ORDER — OXYCODONE-ACETAMINOPHEN 5-325 MG PO TABS: 1.0000 | ORAL_TABLET | ORAL | Status: DC | PRN

## 2014-08-29 NOTE — Discharge Summary (Signed)
Obstetric Discharge Summary Reason for Admission: onset of labor Prenatal Procedures: none Intrapartum Procedures: spontaneous vaginal delivery Postpartum Procedures: none Complications-Operative and Postpartum: second degree perineal laceration HEMOGLOBIN  Date Value Ref Range Status  08/28/2014 10.1* 12.0 - 15.0 g/dL Final   HCT  Date Value Ref Range Status  08/28/2014 32.9* 36.0 - 46.0 % Final    Physical Exam:  General: alert and cooperative Lochia: appropriate Uterine Fundus: firm   Discharge Diagnoses: Term Pregnancy-delivered  Discharge Information: Date: 08/29/2014 Activity: pelvic rest Diet: routine Medications: Ibuprofen and Percocet Condition: improved Instructions: refer to practice specific booklet Discharge to: home Follow-up Information    Follow up with Oliver PilaICHARDSON,Caydon Feasel W, MD In 6 weeks.   Specialty:  Obstetrics and Gynecology   Why:  postpartum check up   Contact information:   [address] [place] [address] [phone]       Newborn Data: Live born female  Birth Weight: 6 lb 7 oz (2920 g) APGAR: 8, 9  Home with mother.  Oliver PilaRICHARDSON,Elizabethann Lackey W 08/29/2014, 10:47 AM

## 2014-08-29 NOTE — Plan of Care (Signed)
Problem: Phase I Progression Outcomes Goal: Pain controlled with appropriate interventions Outcome: Completed/Met Date Met:  08/29/14 Goal: Voiding adequately Outcome: Completed/Met Date Met:  08/29/14 Goal: OOB as tolerated unless otherwise ordered Outcome: Completed/Met Date Met:  08/29/14 Goal: VS, stable, temp < 100.4 degrees F Outcome: Completed/Met Date Met:  08/29/14

## 2014-08-29 NOTE — Progress Notes (Signed)
Post Partum Day 2 Subjective: voiding and tolerating PO  Was able to void yeasterday and did not need catheter.  Mild stinging at stitches  Objective: Blood pressure 129/74, pulse 71, temperature 98.4 F (36.9 C), temperature source Oral, resp. rate 18, height 5\' 7"  (1.702 m), weight 90.901 kg (200 lb 6.4 oz), last menstrual period 09/18/2013, SpO2 100 %, unknown if currently breastfeeding.  Physical Exam:  General: alert and cooperative Lochia: appropriate Uterine Fundus: firm    Recent Labs  08/27/14 1212 08/28/14 0555  HGB 10.4* 10.1*  HCT 32.7* 32.9*    Assessment/Plan: Discharge home   LOS: 2 days   Teresa Lemmerman W 08/29/2014, 10:45 AM

## 2014-08-29 NOTE — Progress Notes (Signed)
CSW followed-up with the MOB in order to continue to provide support and to discuss additional community resources.  MOB was providing skin-to-skin when CSW entered.  When CSW inquired about how she is doing and feeling, she reported, "it's going".  MOB continued to discuss the relational stress secondary to wanting to stay at her grandmother's house for the weekend and her sister stating that she wanted her home.  The MOB did not further process this stress, but shared that she and her sister are "working it out".  She stated that her sister is picking her up at discharge, and has agreed to drop her off at her grandmother's house for the weekend.  She reported desire to stay at her grandmother's home in order to be able to have more time with family and friends in [place] before returning to her sister's home in [place].   CSW continued to process how she feels about being a new mother.  She stated that she can feel overwhelmed at times since the baby is crying and she continues to learn how to differentiate his cries.  MOB continued to report feeling comfortable with the baby and all the education she has received from nursing.  She shared that she used to hold her niece when she was a baby, and discussed that she has recent experience with her sister's infant.  MOB was also able to share with the CSW the information about feeding that she has received from staff regarding how much to feed the baby. Per MOB, she did not hold the baby much yesterday due to physical pain and due to having friends who wanted to hold the baby since they do not live in town.  MOB denied any emotional barriers to bonding.    MOB provided consent for CSW to make [facility] referral.  MOB stated that she has not had an opportunity to complete application for hosing.  CSW reviewed with the MOB where to send the referral once it is completed, and emphasized the importance of following up in order to maintain her spot on the wait list.     No barriers to discharge.   

## 2014-08-29 NOTE — Plan of Care (Signed)
Problem: Discharge Progression Outcomes Goal: Barriers To Progression Addressed/Resolved Outcome: Not Applicable Date Met:  61/60/73 Goal: Activity appropriate for discharge plan Outcome: Completed/Met Date Met:  08/29/14 Goal: Tolerating diet Outcome: Completed/Met Date Met:  71/06/26 Goal: Complications resolved/controlled Outcome: Not Applicable Date Met:  94/85/46 Goal: Pain controlled with appropriate interventions Outcome: Completed/Met Date Met:  08/29/14 Goal: Afebrile, VS remain stable at discharge Outcome: Completed/Met Date Met:  08/29/14 Goal: Remove staples per MD order Outcome: Not Applicable Date Met:  27/03/50 Goal: Discharge plan in place and appropriate Outcome: Completed/Met Date Met:  08/29/14 Goal: Other Discharge Outcomes/Goals Outcome: Not Applicable Date Met:  09/38/18

## 2014-12-23 ENCOUNTER — Encounter (HOSPITAL_COMMUNITY): Payer: Self-pay | Admitting: Emergency Medicine

## 2014-12-23 ENCOUNTER — Emergency Department (INDEPENDENT_AMBULATORY_CARE_PROVIDER_SITE_OTHER)
Admission: EM | Admit: 2014-12-23 | Discharge: 2014-12-23 | Disposition: A | Discharge status: Home / Self Care | Payer: Medicaid Other | Source: Home / Self Care | Attending: Emergency Medicine | Admitting: Emergency Medicine

## 2014-12-23 DIAGNOSIS — J029 Acute pharyngitis, unspecified: Secondary | ICD-10-CM

## 2014-12-23 LAB — POCT RAPID STREP A: Streptococcus, Group A Screen (Direct): NEGATIVE

## 2014-12-23 MED ORDER — CETIRIZINE HCL 10 MG PO TABS: 10.0000 mg | ORAL_TABLET | Freq: Every day | ORAL | Status: DC

## 2014-12-23 NOTE — ED Provider Notes (Signed)
CSN: [id number]     Arrival date & time 12/23/14  1347 History   First MD Initiated Contact with Patient 12/23/14 1422     No chief complaint on file. CC: sore throat  (Consider location/radiation/quality/duration/timing/severity/associated sxs/prior Treatment) HPI She is a 24 year old woman here for evaluation of sore throat. Her symptoms started one week ago. She describes a sore throat as well as laryngitis. She has had nasal congestion and rhinorrhea. She also reports a nonproductive cough. No headaches, fevers, chills, ear pain, body aches, nausea, vomiting. She has tried some cough drops without improvement. No known sick contacts.  Past Medical History  Diagnosis Date  . Seizures     as a child  . Weakness of distal arms and legs    Past Surgical History  Procedure Laterality Date  . No past surgeries     Family History  Problem Relation Age of Onset  . Diabetes Maternal Grandmother   . Hypertension Maternal Grandmother    History  Substance Use Topics  . Smoking status: Never Smoker   . Smokeless tobacco: Never Used  . Alcohol Use: No   OB History    Gravida Para Term Preterm AB TAB SAB Ectopic Multiple Living   1 1 1       0 1     Review of Systems  Constitutional: Negative for fever and chills.  HENT: Positive for congestion, rhinorrhea, sore throat and voice change. Negative for ear pain and trouble swallowing.   Respiratory: Positive for cough. Negative for shortness of breath.   Gastrointestinal: Negative for nausea and vomiting.  Musculoskeletal: Negative for myalgias.  Neurological: Negative for headaches.    Allergies  Review of patient's allergies indicates no known allergies.  Home Medications   Prior to Admission medications   Medication Sig Start Date End Date Taking? Authorizing Provider  cetirizine (ZYRTEC) 10 MG tablet Take 1 tablet (10 mg total) by mouth daily. 12/23/14   Charm RingsErin J Moustafa Mossa, MD  ibuprofen (ADVIL,MOTRIN) 600 MG tablet Take 1 tablet  (600 mg total) by mouth every 6 (six) hours. 08/29/14   Huel CoteKathy Richardson, MD  oxyCODONE-acetaminophen (PERCOCET/ROXICET) 5-325 MG per tablet Take 1 tablet by mouth every 4 (four) hours as needed (for pain scale less than 7). 08/29/14   Huel CoteKathy Richardson, MD   BP 124/82 mmHg  Pulse 73  Temp(Src) 98 F (36.7 C) (Oral)  Resp 16  SpO2 99% Physical Exam  Constitutional: She is oriented to person, place, and time. She appears well-developed and well-nourished. No distress.  HENT:  Head: Normocephalic and atraumatic.  Right Ear: Tympanic membrane normal.  Nose: Mucosal edema present. No rhinorrhea.  Mouth/Throat: Posterior oropharyngeal erythema present. No oropharyngeal exudate.  Cardiovascular: Normal rate, regular rhythm and normal heart sounds.   No murmur heard. Pulmonary/Chest: Effort normal and breath sounds normal. No respiratory distress. She has no wheezes. She has no rales.  Neurological: She is alert and oriented to person, place, and time.    ED Course  Procedures (including critical care time) Labs Review Labs Reviewed  POCT RAPID STREP A ([facility] ONLY)    Imaging Review No results found.   MDM   1. Viral pharyngitis    Rapid strep is negative. Prescription for Zyrtec provided to help with any drainage. Symptomatic care as in after visit summary. Follow-up as needed.    Charm RingsErin J Isley Zinni, MD 12/23/14 [phone]

## 2014-12-23 NOTE — ED Notes (Signed)
Pt states that she has had a sore throat with loss of voice for over a week

## 2014-12-23 NOTE — Discharge Instructions (Signed)
You have a virus. You should start to get better in the next few days. You can take Zyrtec daily to help with any drainage. Saltwater gargles and Chloraseptic spray may help your throat. Follow-up as needed.

## 2014-12-26 LAB — CULTURE, GROUP A STREP: Strep A Culture: NEGATIVE

## 2017-06-01 DIAGNOSIS — Z30013 Encounter for initial prescription of injectable contraceptive: Secondary | ICD-10-CM | POA: Diagnosis not present

## 2017-06-01 DIAGNOSIS — Z113 Encounter for screening for infections with a predominantly sexual mode of transmission: Secondary | ICD-10-CM | POA: Diagnosis not present

## 2017-07-30 ENCOUNTER — Encounter (HOSPITAL_COMMUNITY): Payer: Self-pay | Admitting: Emergency Medicine

## 2017-07-30 ENCOUNTER — Ambulatory Visit (HOSPITAL_COMMUNITY)
Admission: EM | Admit: 2017-07-30 | Discharge: 2017-07-30 | Disposition: A | Discharge status: Home / Self Care | Payer: Medicaid Other | Attending: Family Medicine | Admitting: Family Medicine

## 2017-07-30 DIAGNOSIS — J00 Acute nasopharyngitis [common cold]: Secondary | ICD-10-CM | POA: Diagnosis not present

## 2017-07-30 DIAGNOSIS — J040 Acute laryngitis: Secondary | ICD-10-CM | POA: Diagnosis not present

## 2017-07-30 MED ORDER — CETIRIZINE HCL 10 MG PO TABS: 10.0000 mg | ORAL_TABLET | Freq: Every day | ORAL | 0 refills | Status: AC

## 2017-07-30 MED ORDER — GUAIFENESIN ER 600 MG PO TB12: 600.0000 mg | ORAL_TABLET | Freq: Two times a day (BID) | ORAL | 0 refills | Status: AC

## 2017-07-30 MED ORDER — IBUPROFEN 400 MG PO TABS: 400.0000 mg | ORAL_TABLET | Freq: Four times a day (QID) | ORAL | 0 refills | Status: AC | PRN

## 2017-07-30 NOTE — ED Provider Notes (Signed)
[facility]    CSN: [id number] Arrival date & time: 07/30/17  1325     History   Chief Complaint Chief Complaint  Patient presents with  . Headache  . Sore Throat    HPI Jasmin Wilson is a 26 y.o. female.   Jasmin Wilson presents with complaints of headache, nausea, sore throat, and non productive cough, loss of voice, which started 1 week ago. Without vomiting or diarrhea. Without fevers, shortness of breath or chest pain. Urinating without difficulty. No known ill contacts. Without runny nose or ear pain. Took a cold and flu medication which did not seem to help. States throat is 10/10 pain.    ROS per HPI.       Past Medical History:  Diagnosis Date  . Seizures (HCC)    as a child  . Seizures (HCC)   . Weakness of distal arms and legs     Patient Active Problem List   Diagnosis Date Noted  . Indication for care in labor or delivery 08/27/2014  . Active labor 08/27/2014  . NSVD (normal spontaneous vaginal delivery) 08/27/2014  . Seizures (HCC)   . Weakness of distal arms and legs     Past Surgical History:  Procedure Laterality Date  . NO PAST SURGERIES      OB History    Gravida Para Term Preterm AB Living   1 1 1     1    SAB TAB Ectopic Multiple Live Births         0 1       Home Medications    Prior to Admission medications   Medication Sig Start Date End Date Taking? Authorizing Provider  cetirizine (ZYRTEC) 10 MG tablet Take 1 tablet (10 mg total) by mouth daily. 07/30/17   Georgetta Haber, NP  guaiFENesin (MUCINEX) 600 MG 12 hr tablet Take 1 tablet (600 mg total) by mouth 2 (two) times daily. 07/30/17   Georgetta Haber, NP  ibuprofen (ADVIL,MOTRIN) 400 MG tablet Take 1 tablet (400 mg total) by mouth every 6 (six) hours as needed. 07/30/17   Georgetta Haber, NP    Family History Family History  Problem Relation Age of Onset  . Diabetes Maternal Grandmother   . Hypertension Maternal Grandmother     Social History Social History   Substance Use Topics  . Smoking status: Never Smoker  . Smokeless tobacco: Never Used  . Alcohol use No     Allergies   Patient has no known allergies.   Review of Systems Review of Systems   Physical Exam Triage Vital Signs ED Triage Vitals  Enc Vitals Group     BP 07/30/17 1338 129/78     Pulse Rate 07/30/17 1338 82     Resp 07/30/17 1338 18     Temp 07/30/17 1338 98.2 F (36.8 C)     Temp Source 07/30/17 1338 Oral     SpO2 07/30/17 1338 98 %     Weight --      Height --      Head Circumference --      Peak Flow --      Pain Score 07/30/17 1336 10     Pain Loc --      Pain Edu? --      Excl. in GC? --    No data found.   Updated Vital Signs BP 129/78 (BP Location: Left Arm)   Pulse 82   Temp 98.2 F (36.8 C) (Oral)  Resp 18   LMP 07/11/2017   SpO2 98%   Visual Acuity Right Eye Distance:   Left Eye Distance:   Bilateral Distance:    Right Eye Near:   Left Eye Near:    Bilateral Near:     Physical Exam  Constitutional: She is oriented to person, place, and time. She appears well-developed and well-nourished. No distress.  HENT:  Head: Normocephalic and atraumatic.  Right Ear: Tympanic membrane, external ear and ear canal normal.  Left Ear: Tympanic membrane, external ear and ear canal normal.  Nose: Nose normal. Right sinus exhibits no maxillary sinus tenderness and no frontal sinus tenderness. Left sinus exhibits no maxillary sinus tenderness and no frontal sinus tenderness.  Mouth/Throat: Uvula is midline and mucous membranes are normal. Posterior oropharyngeal erythema present. No posterior oropharyngeal edema. No tonsillar exudate.  Hoarse voice noted  Eyes: Pupils are equal, round, and reactive to light. Conjunctivae and EOM are normal.  Neck: Normal range of motion.  Cardiovascular: Normal rate, regular rhythm and normal heart sounds.   Pulmonary/Chest: Effort normal and breath sounds normal. No respiratory distress. She has no wheezes.    Abdominal: Soft. There is no tenderness.  Lymphadenopathy:    She has no cervical adenopathy.  Neurological: She is alert and oriented to person, place, and time.  Skin: Skin is warm and dry.  Vitals reviewed.    UC Treatments / Results  Labs (all labs ordered are listed, but only abnormal results are displayed) Labs Reviewed - No data to display  EKG  EKG Interpretation None       Radiology No results found.  Procedures Procedures (including critical care time)  Medications Ordered in UC Medications - No data to display   Initial Impression / Assessment and Plan / UC Course  I have reviewed the triage vital signs and the nursing notes.  Pertinent labs & imaging results that were available during my care of the patient were reviewed by me and considered in my medical decision making (see chart for details).     Vitals stable, without acute findings on exam, non toxic in appearance. Continue with supportive cares for likely viral illness. Push fluids. If symptoms worsen or do not improve in the next week to return to be seen or to follow up with PCP. Patient verbalized understanding and agreeable to plan.      Final Clinical Impressions(s) / UC Diagnoses   Final diagnoses:  Nasopharyngitis  Laryngitis    New Prescriptions Discharge Medication List as of 07/30/2017  1:51 PM    START taking these medications   Details  guaiFENesin (MUCINEX) 600 MG 12 hr tablet Take 1 tablet (600 mg total) by mouth 2 (two) times daily., Starting Sat 07/30/2017, Normal         Controlled Substance Prescriptions [place] Controlled Substance Registry consulted? Not Applicable   Georgetta HaberBurky, Miroslav Gin B, NP 07/30/17 1411

## 2017-07-30 NOTE — ED Triage Notes (Signed)
Sore throat started last week.  Patient has headache and has been coughing

## 2017-10-04 DIAGNOSIS — Z30013 Encounter for initial prescription of injectable contraceptive: Secondary | ICD-10-CM | POA: Diagnosis not present

## 2017-10-04 DIAGNOSIS — Z113 Encounter for screening for infections with a predominantly sexual mode of transmission: Secondary | ICD-10-CM | POA: Diagnosis not present

## 2017-10-04 DIAGNOSIS — Z3202 Encounter for pregnancy test, result negative: Secondary | ICD-10-CM | POA: Diagnosis not present

## 2018-01-27 DIAGNOSIS — Z3009 Encounter for other general counseling and advice on contraception: Secondary | ICD-10-CM | POA: Diagnosis not present

## 2018-01-27 DIAGNOSIS — Z3042 Encounter for surveillance of injectable contraceptive: Secondary | ICD-10-CM | POA: Diagnosis not present

## 2018-01-27 DIAGNOSIS — Z3200 Encounter for pregnancy test, result unknown: Secondary | ICD-10-CM | POA: Diagnosis not present

## 2018-05-05 DIAGNOSIS — Z3009 Encounter for other general counseling and advice on contraception: Secondary | ICD-10-CM | POA: Diagnosis not present

## 2018-05-05 DIAGNOSIS — Z3042 Encounter for surveillance of injectable contraceptive: Secondary | ICD-10-CM | POA: Diagnosis not present

## 2018-07-23 DIAGNOSIS — R079 Chest pain, unspecified: Secondary | ICD-10-CM | POA: Diagnosis not present

## 2018-07-23 DIAGNOSIS — R072 Precordial pain: Secondary | ICD-10-CM | POA: Diagnosis not present

## 2018-07-23 DIAGNOSIS — I499 Cardiac arrhythmia, unspecified: Secondary | ICD-10-CM | POA: Diagnosis not present

## 2018-07-24 DIAGNOSIS — I499 Cardiac arrhythmia, unspecified: Secondary | ICD-10-CM | POA: Diagnosis not present

## 2018-10-29 DIAGNOSIS — R410 Disorientation, unspecified: Secondary | ICD-10-CM | POA: Diagnosis not present

## 2018-10-29 DIAGNOSIS — R42 Dizziness and giddiness: Secondary | ICD-10-CM | POA: Diagnosis not present

## 2018-10-29 DIAGNOSIS — R404 Transient alteration of awareness: Secondary | ICD-10-CM | POA: Diagnosis not present

## 2018-10-29 DIAGNOSIS — R51 Headache: Secondary | ICD-10-CM | POA: Diagnosis not present

## 2018-10-29 DIAGNOSIS — R0689 Other abnormalities of breathing: Secondary | ICD-10-CM | POA: Diagnosis not present

## 2018-10-29 DIAGNOSIS — R079 Chest pain, unspecified: Secondary | ICD-10-CM | POA: Diagnosis not present

## 2018-10-29 DIAGNOSIS — I10 Essential (primary) hypertension: Secondary | ICD-10-CM | POA: Diagnosis not present

## 2018-10-29 DIAGNOSIS — R000 Tachycardia, unspecified: Secondary | ICD-10-CM | POA: Diagnosis not present

## 2018-10-29 DIAGNOSIS — J111 Influenza due to unidentified influenza virus with other respiratory manifestations: Secondary | ICD-10-CM | POA: Diagnosis not present

## 2019-12-15 DIAGNOSIS — J209 Acute bronchitis, unspecified: Secondary | ICD-10-CM | POA: Diagnosis not present

## 2019-12-15 DIAGNOSIS — Z20822 Contact with and (suspected) exposure to covid-19: Secondary | ICD-10-CM | POA: Diagnosis not present

## 2024-04-23 ENCOUNTER — Telehealth: Payer: Self-pay | Admitting: Neurology

## 2024-04-23 NOTE — Telephone Encounter (Signed)
 Care everywhere
